# Patient Record
Sex: Male | Born: 1950 | Race: White | Hispanic: No | Marital: Married | Smoking: Former smoker
Health system: Southern US, Community
[De-identification: ages and names within clinical notes are randomized; demographics above are authoritative.]

## PROBLEM LIST (undated history)

## (undated) DIAGNOSIS — E039 Hypothyroidism, unspecified: Secondary | ICD-10-CM

## (undated) DIAGNOSIS — R42 Dizziness and giddiness: Secondary | ICD-10-CM

## (undated) DIAGNOSIS — M1990 Unspecified osteoarthritis, unspecified site: Secondary | ICD-10-CM

## (undated) DIAGNOSIS — Z8619 Personal history of other infectious and parasitic diseases: Secondary | ICD-10-CM

## (undated) DIAGNOSIS — G4730 Sleep apnea, unspecified: Secondary | ICD-10-CM

## (undated) DIAGNOSIS — S83281A Other tear of lateral meniscus, current injury, right knee, initial encounter: Secondary | ICD-10-CM

## (undated) DIAGNOSIS — E785 Hyperlipidemia, unspecified: Secondary | ICD-10-CM

## (undated) DIAGNOSIS — T7840XA Allergy, unspecified, initial encounter: Secondary | ICD-10-CM

## (undated) DIAGNOSIS — K635 Polyp of colon: Secondary | ICD-10-CM

## (undated) HISTORY — PX: JOINT REPLACEMENT: SHX530

## (undated) HISTORY — DX: Unspecified osteoarthritis, unspecified site: M19.90

## (undated) HISTORY — PX: TONSILLECTOMY AND ADENOIDECTOMY: SUR1326

## (undated) HISTORY — DX: Personal history of other infectious and parasitic diseases: Z86.19

## (undated) HISTORY — PX: COLONOSCOPY: SHX174

## (undated) HISTORY — DX: Dizziness and giddiness: R42

## (undated) HISTORY — DX: Allergy, unspecified, initial encounter: T78.40XA

## (undated) HISTORY — DX: Hyperlipidemia, unspecified: E78.5

## (undated) HISTORY — DX: Polyp of colon: K63.5

## (undated) HISTORY — PX: OTHER SURGICAL HISTORY: SHX169

---

## 2007-12-19 HISTORY — PX: OTHER SURGICAL HISTORY: SHX169

## 2011-10-19 HISTORY — PX: REPLACEMENT TOTAL KNEE: SUR1224

## 2018-03-18 HISTORY — PX: MENISCUS REPAIR: SHX5179

## 2019-09-09 ENCOUNTER — Ambulatory Visit (INDEPENDENT_AMBULATORY_CARE_PROVIDER_SITE_OTHER): Payer: Medicare Other | Admitting: Physician Assistant

## 2019-09-09 ENCOUNTER — Encounter: Payer: Self-pay | Admitting: Physician Assistant

## 2019-09-09 ENCOUNTER — Other Ambulatory Visit: Payer: Self-pay

## 2019-09-09 DIAGNOSIS — E7800 Pure hypercholesterolemia, unspecified: Secondary | ICD-10-CM | POA: Diagnosis not present

## 2019-09-09 DIAGNOSIS — Z86010 Personal history of colonic polyps: Secondary | ICD-10-CM | POA: Diagnosis not present

## 2019-09-09 DIAGNOSIS — M1712 Unilateral primary osteoarthritis, left knee: Secondary | ICD-10-CM | POA: Diagnosis not present

## 2019-09-09 DIAGNOSIS — E039 Hypothyroidism, unspecified: Secondary | ICD-10-CM | POA: Diagnosis not present

## 2019-09-09 DIAGNOSIS — E785 Hyperlipidemia, unspecified: Secondary | ICD-10-CM | POA: Insufficient documentation

## 2019-09-09 NOTE — Progress Notes (Signed)
Patient presents to clinic today to establish care.  Acute Concerns: Patient requesting referral to a local orthopedic provider. 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 Has had SLAP tear repair and rotator cuff repairs as well several years prior. Notes deterioration in the R knee. Was last seen by Ortho in the fall in [place]. Was getting injections at the time and told if not helping he would need to consider arthroscopy versus TKR. Marland Kitchen   Chronic Issues: Hyperlipidemia -- Is currently on a regimen of Simvastatin 20 mg daily. Endorses diet is well-balanced overall but needs to work on increased vegetable intake. Exercise is limited due to issues with knees.   Hypothyroidism -- Patient currently on a regimen of levothyroxine 75 mcg daily. Endorses taking medication daily as directed. Endorses last TSH level about 1 year ago. Denies constipation or excessive fatigue.   Health Maintenance: Immunizations -- Declines Flu shot today. Will need records for other immunizations.  Colonoscopy -- Endorses last in 11/2018. + history of colon polyps. Benign polyps on last colonoscopy. Will obtain records to verify but plan on 5-year recall.   Past Medical History:  Diagnosis Date  . Allergy   . Arthritis   . Colon polyps   . History of chickenpox   . Hyperlipidemia     Past Surgical History:  Procedure Laterality Date  . DG SHOULDER LEFT COMPLETE Left 06/2013  . MENISCUS REPAIR Right 03/2018  . 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 TONSILLECTOMY AND ADENOIDECTOMY      Current Outpatient Medications on File Prior to Visit  Medication Sig Dispense Refill  . Ascorbic Acid (VITAMIN C) 1000 MG tablet Take 1,000 mg by mouth daily.    . ferrous sulfate 325 (65 FE) MG EC tablet Take 325 mg by mouth daily with breakfast.    . Glucosamine-Chondroit-Vit C-Mn (GLUCOSAMINE CHONDR 1500 COMPLX) CAPS Take 1 capsule by mouth daily.    Marland Kitchen levothyroxine (SYNTHROID) 75  MCG tablet Take 1 tablet by mouth daily.    . Multiple Vitamins-Minerals (MULTIVITAMIN ADULTS 50+) TABS Take 1 tablet by mouth daily.    . Omega-3 Fatty Acids (FISH OIL TRIPLE STRENGTH) 1400 MG CAPS Take 1 capsule by mouth daily.    . saw palmetto (V-R SAW PALMETTO) 160 MG capsule Take 160 mg by mouth 2 (two) times daily.    . simvastatin (ZOCOR) 20 MG tablet Take 1 tablet by mouth daily.     No current facility-administered medications on file prior to visit.     No Known Allergies  Family History  Problem Relation Age of Onset  . COPD Mother     Social History   Socioeconomic History  . Marital status: Married    Spouse name: Not on file  . Number of children: Not on file  . Years of education: Not on file  . Highest education level: Not on file  Occupational History  . Not on file  Social Needs  . Financial resource strain: Not on file  . Food insecurity    Worry: Not on file    Inability: Not on file  . Transportation needs    Medical: Not on file    Non-medical: Not on file  Tobacco Use  . Smoking status: Former Research scientist (life sciences)  . Smokeless tobacco: Never Used  Substance and Sexual Activity  . Alcohol use: Yes  . Drug use: Not Currently  . Sexual activity: Not Currently  Lifestyle  . Physical activity  Days per week: Not on file    Minutes per session: Not on file  . Stress: Not on file  Relationships  . Social Musician on phone: Not on file    Gets together: Not on file    Attends religious service: Not on file    Active member of club or organization: Not on file    Attends meetings of clubs or organizations: Not on file    Relationship status: Not on file  . Intimate partner violence    Fear of current or ex partner: Not on file    Emotionally abused: Not on file    Physically abused: Not on file    Forced sexual activity: Not on file  Other Topics Concern  . Not on file  Social History Narrative  . Not on file   Review of Systems   Constitutional: Negative for fever, malaise/fatigue and weight loss.  Eyes: Negative for blurred vision and double vision.  Respiratory: Negative for shortness of breath.   Cardiovascular: Negative for chest pain and palpitations.  Genitourinary: Negative for dysuria, flank pain, frequency, hematuria and urgency.  Musculoskeletal: Positive for joint pain (chronic -- L knee mainly). Negative for back pain, falls, myalgias and neck pain.  Psychiatric/Behavioral: Negative for depression. The patient is not nervous/anxious.    BP 128/80   Pulse 62   Temp 98.4 F (36.9 C) (Skin)   Resp 16   Ht 5\' 7"  (1.702 m)   Wt 227 lb (103 kg)   SpO2 98%   BMI 35.55 kg/m   Physical Exam Vitals signs reviewed.  Constitutional:      Appearance: Normal appearance.  HENT:     Head: Normocephalic and atraumatic.     Right Ear: Tympanic membrane normal.     Left Ear: Tympanic membrane normal.     Nose: Nose normal.     Mouth/Throat:     Mouth: Mucous membranes are moist.  Eyes:     Conjunctiva/sclera: Conjunctivae normal.     Pupils: Pupils are equal, round, and reactive to light.  Neck:     Musculoskeletal: Neck supple.  Cardiovascular:     Rate and Rhythm: Normal rate and regular rhythm.     Pulses: Normal pulses.     Heart sounds: Normal heart sounds.  Pulmonary:     Breath sounds: Normal breath sounds.  Neurological:     General: No focal deficit present.     Mental Status: He is alert and oriented to person, place, and time.  Psychiatric:        Mood and Affect: Mood normal.     Assessment/Plan: 1. Pure hypercholesterolemia Taking statin as directed. Will review records to see when he is due for CPE and repeat labs. Dietary and exercise recommendations reviewed.  2. Acquired hypothyroidism Taking levothyroxine as directed. No symptoms present of uncontrolled hypothyroidism. Will get patient scheduled for CPE.  3. Primary osteoarthritis of left knee Referral to Orthopedics placed  giving worsening symptoms despite knee injections.  4. History of colon polyps Colonoscopy UTD -- last 11/2018 per patient. Records requested.    12/2018, PA-C

## 2019-09-09 NOTE — Patient Instructions (Addendum)
I will review records and we will get you and wife scheduled for physicals when due.   You will be contacted by an Orthopedic Specialist in the area for further assessment of chronic knee issues.   It was very nice meeting you today. Welcome to [facility]!

## 2019-09-15 ENCOUNTER — Telehealth: Payer: Self-pay | Admitting: Physician Assistant

## 2019-09-15 DIAGNOSIS — R0683 Snoring: Secondary | ICD-10-CM

## 2019-09-15 DIAGNOSIS — R0681 Apnea, not elsewhere classified: Secondary | ICD-10-CM

## 2019-09-15 NOTE — Telephone Encounter (Signed)
Patient called in wanting to see if he could get a referral for a Sleep Study. Please advise.

## 2019-09-17 NOTE — Telephone Encounter (Signed)
Spoke with patient and he says his wife complains of him snoring for years, she has noticed some apnea episodes, and does have some daytime sleepiness.  [place] for order for sleep study

## 2019-09-17 NOTE — Addendum Note (Signed)
Addended by: Raiford Noble C on: 09/17/2019 04:00 PM   Modules accepted: Orders

## 2019-09-17 NOTE — Telephone Encounter (Signed)
Referral to sleep medicine placed for assessment and to set up sleep testing.

## 2019-09-22 ENCOUNTER — Telehealth: Payer: Self-pay | Admitting: Physician Assistant

## 2019-09-22 DIAGNOSIS — M1712 Unilateral primary osteoarthritis, left knee: Secondary | ICD-10-CM

## 2019-09-22 NOTE — Addendum Note (Signed)
Addended by: Brunetta Jeans on: 09/22/2019 01:49 PM   Modules accepted: Orders

## 2019-09-22 NOTE — Telephone Encounter (Signed)
Patient calling in stating he would like a orthopedic referal for his knee. Please Advise.

## 2019-09-22 NOTE — Telephone Encounter (Signed)
Referral placed. Patient will be contacted to schedule. 

## 2019-10-06 ENCOUNTER — Telehealth: Payer: Self-pay | Admitting: Physician Assistant

## 2019-10-06 ENCOUNTER — Other Ambulatory Visit: Payer: Self-pay | Admitting: Physician Assistant

## 2019-10-06 MED ORDER — LEVOTHYROXINE SODIUM 75 MCG PO TABS: 75.0000 ug | ORAL_TABLET | Freq: Every day | ORAL | 1 refills | Status: DC

## 2019-10-06 NOTE — Telephone Encounter (Signed)
Will send in 78-month supply. He was supposed to schedule CPE with labs after last visit (NP appt) so we could update thyroid levels at the same time as routine labs.

## 2019-10-06 NOTE — Telephone Encounter (Signed)
Patient aware that 1 month script sent to pharmacy and has scheduled CPE 11.16.20

## 2019-10-06 NOTE — Telephone Encounter (Signed)
Refill Request  levothyroxine (SYNTHROID) 75 MCG tablet     [phone] (Phone) [phone] (Fax)   Patient has 2 days left.

## 2019-10-07 ENCOUNTER — Other Ambulatory Visit: Payer: Self-pay

## 2019-10-07 ENCOUNTER — Ambulatory Visit (INDEPENDENT_AMBULATORY_CARE_PROVIDER_SITE_OTHER): Payer: Medicare Other | Admitting: Pulmonary Disease

## 2019-10-07 ENCOUNTER — Encounter: Payer: Self-pay | Admitting: Pulmonary Disease

## 2019-10-07 VITALS — BP 128/80 | HR 70 | Temp 97.2°F | Ht 67.0 in | Wt 222.0 lb

## 2019-10-07 DIAGNOSIS — G4733 Obstructive sleep apnea (adult) (pediatric): Secondary | ICD-10-CM | POA: Diagnosis not present

## 2019-10-07 NOTE — Patient Instructions (Signed)
Moderate probability of significant sleep disordered breathing  We will schedule you for home sleep study Treatment options as discussed  I will see you in about 3 months  Call with significant concerns Sleep Apnea Sleep apnea is a condition in which breathing pauses or becomes shallow during sleep. Episodes of sleep apnea usually last 10 seconds or longer, and they may occur as many as 20 times an hour. Sleep apnea disrupts your sleep and keeps your body from getting the rest that it needs. This condition can increase your risk of certain health problems, including:  Heart attack.  Stroke.  Obesity.  Diabetes.  Heart failure.  Irregular heartbeat. What are the causes? There are three kinds of sleep apnea:  Obstructive sleep apnea. This kind is caused by a blocked or collapsed airway.  Central sleep apnea. This kind happens when the part of the brain that controls breathing does not send the correct signals to the muscles that control breathing.  Mixed sleep apnea. This is a combination of obstructive and central sleep apnea. The most common cause of this condition is a collapsed or blocked airway. An airway can collapse or become blocked if:  Your throat muscles are abnormally relaxed.  Your tongue and tonsils are larger than normal.  You are overweight.  Your airway is smaller than normal. What increases the risk? You are more likely to develop this condition if you:  Are overweight.  Smoke.  Have a smaller than normal airway.  Are elderly.  Are male.  Drink alcohol.  Take sedatives or tranquilizers.  Have a family history of sleep apnea. What are the signs or symptoms? Symptoms of this condition include:  Trouble staying asleep.  Daytime sleepiness and tiredness.  Irritability.  Loud snoring.  Morning headaches.  Trouble concentrating.  Forgetfulness.  Decreased interest in sex.  Unexplained sleepiness.  Mood swings.  Personality  changes.  Feelings of depression.  Waking up often during the night to urinate.  Dry mouth.  Sore throat. How is this diagnosed? This condition may be diagnosed with:  A medical history.  A physical exam.  A series of tests that are done while you are sleeping (sleep study). These tests are usually done in a sleep lab, but they may also be done at home. How is this treated? Treatment for this condition aims to restore normal breathing and to ease symptoms during sleep. It may involve managing health issues that can affect breathing, such as high blood pressure or obesity. Treatment may include:  Sleeping on your side.  Using a decongestant if you have nasal congestion.  Avoiding the use of depressants, including alcohol, sedatives, and narcotics.  Losing weight if you are overweight.  Making changes to your diet.  Quitting smoking.  Using a device to open your airway while you sleep, such as: ? An oral appliance. This is a custom-made mouthpiece that shifts your lower jaw forward. ? A continuous positive airway pressure (CPAP) device. This device blows air through a mask when you breathe out (exhale). ? A nasal expiratory positive airway pressure (EPAP) device. This device has valves that you put into each nostril. ? A bi-level positive airway pressure (BPAP) device. This device blows air through a mask when you breathe in (inhale) and breathe out (exhale).  Having surgery if other treatments do not work. During surgery, excess tissue is removed to create a wider airway. It is important to get treatment for sleep apnea. Without treatment, this condition can lead to:  High blood pressure.  Coronary artery disease.  In men, an inability to achieve or maintain an erection (impotence).  Reduced thinking abilities. Follow these instructions at home: Lifestyle  Make any lifestyle changes that your health care provider recommends.  Eat a healthy, well-balanced diet.   Take steps to lose weight if you are overweight.  Avoid using depressants, including alcohol, sedatives, and narcotics.  Do not use any products that contain nicotine or tobacco, such as cigarettes, e-cigarettes, and chewing tobacco. If you need help quitting, ask your health care provider. General instructions  Take over-the-counter and prescription medicines only as told by your health care provider.  If you were given a device to open your airway while you sleep, use it only as told by your health care provider.  If you are having surgery, make sure to tell your health care provider you have sleep apnea. You may need to bring your device with you.  Keep all follow-up visits as told by your health care provider. This is important. Contact a health care provider if:  The device that you received to open your airway during sleep is uncomfortable or does not seem to be working.  Your symptoms do not improve.  Your symptoms get worse. Get help right away if:  You develop: ? Chest pain. ? Shortness of breath. ? Discomfort in your back, arms, or stomach.  You have: ? Trouble speaking. ? Weakness on one side of your body. ? Drooping in your face. These symptoms may represent a serious problem that is an emergency. Do not wait to see if the symptoms will go away. Get medical help right away. Call your local emergency services (911 in the [place] Do not drive yourself to the hospital. Summary  Sleep apnea is a condition in which breathing pauses or becomes shallow during sleep.  The most common cause is a collapsed or blocked airway.  The goal of treatment is to restore normal breathing and to ease symptoms during sleep. This information is not intended to replace advice given to you by your health care provider. Make sure you discuss any questions you have with your health care provider. Document Released: 11/24/2002 Document Revised: 09/20/2018 Document Reviewed: 07/30/2018  Elsevier Patient Education  2020 [employer].

## 2019-10-07 NOTE — Progress Notes (Signed)
Subjective:    Patient ID: Eric Kerr, male    DOB: 1951-05-04, 68 y.o.   MRN: [id number]  Patient with a history of snoring, concern about witnessed apneas  Patient has been told multiple times by spouse and acquaintances about his snoring Usually goes to bed about 11:50 Takes about 10 to 20 minutes to fall asleep  Wakes up 3-4 times during the night Awakening time about 7:54 AM  History of snoring Dryness of his mouth in the mornings His memory has been fine Rare headaches in the mornings  No night sweats  He does admit to some choking at night  No history of heart disease  Sleep environment is conducive to sleep, occasionally does have a pet in bed    Past Medical History:  Diagnosis Date  . Allergy   . Arthritis   . Colon polyps   . History of chickenpox   . Hyperlipidemia    Social History   Socioeconomic History  . Marital status: Married    Spouse name: Not on file  . Number of children: Not on file  . Years of education: Not on file  . Highest education level: Not on file  Occupational History  . Not on file  Social Needs  . Financial resource strain: Not on file  . Food insecurity    Worry: Not on file    Inability: Not on file  . Transportation needs    Medical: Not on file    Non-medical: Not on file  Tobacco Use  . Smoking status: Former Smoker    Packs/day: 0.25    Years: 10.00    Pack years: 2.50    Types: Cigarettes    Quit date: 2010    Years since quitting: 10.8  . Smokeless tobacco: Never Used  Substance and Sexual Activity  . Alcohol use: Yes  . Drug use: Not Currently  . Sexual activity: Not Currently  Lifestyle  . Physical activity    Days per week: Not on file    Minutes per session: Not on file  . Stress: Not on file  Relationships  . Social Musician on phone: Not on file    Gets together: Not on file    Attends religious service: Not on file    Active member of club or organization: Not on file   Attends meetings of clubs or organizations: Not on file    Relationship status: Not on file  . Intimate partner violence    Fear of current or ex partner: Not on file    Emotionally abused: Not on file    Physically abused: Not on file    Forced sexual activity: Not on file  Other Topics Concern  . Not on file  Social History Narrative  . Not on file   Family History  Problem Relation Age of Onset  . COPD Mother     Review of Systems  Constitutional: Negative.  Negative for appetite change and fatigue.  HENT: Negative.   Eyes: Negative.   Respiratory: Positive for apnea.   Cardiovascular: Negative.   Gastrointestinal: Negative.   Endocrine: Negative.       Objective:   Physical Exam Constitutional:      Appearance: He is obese.  HENT:     Head: Normocephalic and atraumatic.     Nose: Nose normal. No congestion.     Mouth/Throat:     Mouth: Mucous membranes are moist.     Comments: Crowded  oropharynx, Mallampati 2 Eyes:     General:        Right eye: No discharge.        Left eye: No discharge.     Pupils: Pupils are equal, round, and reactive to light.  Neck:     Musculoskeletal: Normal range of motion.  Cardiovascular:     Rate and Rhythm: Normal rate and regular rhythm.     Pulses: Normal pulses.     Heart sounds: No murmur. No friction rub.  Pulmonary:     Effort: Pulmonary effort is normal. No respiratory distress.     Breath sounds: Normal breath sounds. No stridor. No wheezing, rhonchi or rales.  Abdominal:     General: There is no distension.  Musculoskeletal: Normal range of motion.        General: No swelling.  Skin:    General: Skin is warm.  Neurological:     General: No focal deficit present.     Mental Status: He is alert.  Psychiatric:        Mood and Affect: Mood normal.    Vitals:   10/07/19 1633  BP: 128/80  Pulse: 70  Temp: (!) 97.2 F (36.2 C)  SpO2: 96%   Results of the Epworth flowsheet 10/07/2019  Sitting and reading 2   Watching TV 2  Sitting, inactive in a public place (e.g. a theatre or a meeting) 1  As a passenger in a car for an hour without a break 1  Lying down to rest in the afternoon when circumstances permit 2  Sitting and talking to someone 0  Sitting quietly after a lunch without alcohol 2  In a car, while stopped for a few minutes in traffic 0  Total score 10      Assessment & Plan:  High probability for significant obstructive sleep apnea .  Snoring, witnessed apnea, excessive daytime sleepiness, gasping respirations at night .  Crowded oropharynx  Obesity .  Weight gain over the years  Excessive daytime sleepiness .  Sleepiness most afternoons  Nonrestorative sleep .  Wakes up many days feeling like he has not a good nights rest  Pathophysiology of sleep disordered breathing discussed with the patient  Treatment options for sleep disordered breathing discussed with the patient   Plan: We will schedule the patient for home sleep study  Importance of exercise and weight loss discussed with the patient  I will see him back in the office in about 3 months

## 2019-10-28 ENCOUNTER — Other Ambulatory Visit: Payer: Self-pay

## 2019-10-28 ENCOUNTER — Ambulatory Visit: Payer: Medicare Other

## 2019-10-28 DIAGNOSIS — G4733 Obstructive sleep apnea (adult) (pediatric): Secondary | ICD-10-CM | POA: Diagnosis not present

## 2019-10-29 DIAGNOSIS — G4733 Obstructive sleep apnea (adult) (pediatric): Secondary | ICD-10-CM | POA: Diagnosis not present

## 2019-10-31 ENCOUNTER — Telehealth: Payer: Self-pay | Admitting: Pulmonary Disease

## 2019-10-31 DIAGNOSIS — G4733 Obstructive sleep apnea (adult) (pediatric): Secondary | ICD-10-CM

## 2019-10-31 NOTE — Telephone Encounter (Signed)
Called and spoke with Patient.  Dr. Judson Roch recommendations given.  Understanding stated.  DME order placed.  Patient stated he would contact office for appointment once cpap was started.  Nothing further at this time.  Dr. Ander Slade has reviewed the home sleep test this showed moderate obstructive sleep apnea.   Recommendations   Treatment options are CPAP with the settings auto 5 to 15.    Weight loss measures .   Advise against driving while sleepy & against medication with sedative side effects.    Make appointment for 3 months for compliance with download with Dr. Ander Slade.

## 2019-11-03 ENCOUNTER — Encounter: Payer: Medicare Other | Admitting: Physician Assistant

## 2019-11-05 ENCOUNTER — Other Ambulatory Visit: Payer: Self-pay

## 2019-11-05 ENCOUNTER — Ambulatory Visit (INDEPENDENT_AMBULATORY_CARE_PROVIDER_SITE_OTHER): Payer: Medicare Other | Admitting: Physician Assistant

## 2019-11-05 ENCOUNTER — Encounter: Payer: Self-pay | Admitting: Physician Assistant

## 2019-11-05 VITALS — BP 124/80 | HR 67 | Temp 98.2°F | Resp 16 | Ht 67.75 in | Wt 233.0 lb

## 2019-11-05 DIAGNOSIS — Z1159 Encounter for screening for other viral diseases: Secondary | ICD-10-CM

## 2019-11-05 DIAGNOSIS — G4733 Obstructive sleep apnea (adult) (pediatric): Secondary | ICD-10-CM | POA: Diagnosis not present

## 2019-11-05 DIAGNOSIS — E039 Hypothyroidism, unspecified: Secondary | ICD-10-CM

## 2019-11-05 DIAGNOSIS — Z23 Encounter for immunization: Secondary | ICD-10-CM | POA: Diagnosis not present

## 2019-11-05 DIAGNOSIS — Z0000 Encounter for general adult medical examination without abnormal findings: Secondary | ICD-10-CM

## 2019-11-05 DIAGNOSIS — E7800 Pure hypercholesterolemia, unspecified: Secondary | ICD-10-CM

## 2019-11-05 DIAGNOSIS — Z125 Encounter for screening for malignant neoplasm of prostate: Secondary | ICD-10-CM

## 2019-11-05 LAB — TSH: TSH: 3.43 u[IU]/mL (ref 0.35–4.50)

## 2019-11-05 LAB — LIPID PANEL
Cholesterol: 149 mg/dL (ref 0–200)
HDL: 47.1 mg/dL (ref >39.00)
LDL Cholesterol: 87 mg/dL (ref 0–99)
NonHDL: 102.07
Total CHOL/HDL Ratio: 3
Triglycerides: 77 mg/dL (ref 0.0–149.0)
VLDL: 15.4 mg/dL (ref 0.0–40.0)

## 2019-11-05 LAB — COMPREHENSIVE METABOLIC PANEL
ALT: 22 U/L (ref 0–53)
AST: 18 U/L (ref 0–37)
Albumin: 4.3 g/dL (ref 3.5–5.2)
Alkaline Phosphatase: 74 U/L (ref 39–117)
BUN: 9 mg/dL (ref 6–23)
CO2: 30 mEq/L (ref 19–32)
Calcium: 9.2 mg/dL (ref 8.4–10.5)
Chloride: 102 mEq/L (ref 96–112)
Creatinine, Ser: 0.99 mg/dL (ref 0.40–1.50)
GFR: 75.45 mL/min (ref >60.00)
Glucose, Bld: 94 mg/dL (ref 70–99)
Potassium: 4.3 mEq/L (ref 3.5–5.1)
Sodium: 139 mEq/L (ref 135–145)
Total Bilirubin: 0.6 mg/dL (ref 0.2–1.2)
Total Protein: 7 g/dL (ref 6.0–8.3)

## 2019-11-05 LAB — PSA, MEDICARE: PSA: 1.4 ng/ml (ref 0.10–4.00)

## 2019-11-05 MED ORDER — SIMVASTATIN 20 MG PO TABS: 20.0000 mg | ORAL_TABLET | Freq: Every day | ORAL | 1 refills | Status: DC

## 2019-11-05 MED ORDER — LEVOTHYROXINE SODIUM 75 MCG PO TABS: 75.0000 ug | ORAL_TABLET | Freq: Every day | ORAL | 1 refills | Status: DC

## 2019-11-05 NOTE — Progress Notes (Signed)
Subjective:   Eric Kerr is a 68 y.o. male who presents for an Initial Medicare Annual Wellness Visit.  Hypothyroidism-- levothyroxine 75 mcg, taking as directed, tolerating well. Denies chest pain, palpitations, diaphoresis, temperature intolerance, changes to nair or nails, changes to mood.  Hyperlipidemia-- taking simvastatin 20 mg, tolerating well. Endorses well balanced diet. Stays well hydrated throughout the day. Exercise on stationary bike, body weight activities 3x/week, walks dog daily.   Sleep apnea-- started CPAP last night  Review of Systems  Review of Systems  Constitutional: Negative for chills, fever and weight loss.  HENT: Negative for hearing loss and tinnitus.   Eyes: Negative for blurred vision and double vision.  Respiratory: Negative for cough and shortness of breath.   Cardiovascular: Negative for chest pain and palpitations.  Gastrointestinal: Negative for abdominal pain, blood in stool, constipation, diarrhea, heartburn, melena, nausea and vomiting.  Genitourinary: Negative for dysuria, frequency and urgency.       Nocturia 1-2x/night  Musculoskeletal: Positive for joint pain (knee pain).  Neurological: Negative for dizziness, loss of consciousness and headaches.  Psychiatric/Behavioral: Negative for depression and suicidal ideas. The patient has insomnia (sleep apnea). The patient is not nervous/anxious.         Objective:    Today's Vitals   11/05/19 0931  Resp: 16  Weight: 233 lb (105.7 kg)  Height: 5' 7.75" (1.721 m)   Body mass index is 35.69 kg/m.  Advanced Directives 11/05/2019  Does Patient Have a Medical Advance Directive? Yes  Type of Paramedic of [place];Living will  Copy of [place] in Chart? No - copy requested    Current Medications (verified) Outpatient Encounter Medications as of 11/05/2019  Medication Sig  . Ascorbic Acid (VITAMIN C) 1000 MG tablet Take 1,000 mg by mouth  daily.  . ferrous sulfate 325 (65 FE) MG EC tablet Take 325 mg by mouth daily with breakfast.  . Glucosamine-Chondroit-Vit C-Mn (GLUCOSAMINE CHONDR 1500 COMPLX) CAPS Take 1 capsule by mouth daily.  Marland Kitchen levothyroxine (SYNTHROID) 75 MCG tablet Take 1 tablet (75 mcg total) by mouth daily.  . Multiple Vitamins-Minerals (MULTIVITAMIN ADULTS 50+) TABS Take 1 tablet by mouth daily.  . Omega-3 Fatty Acids (FISH OIL TRIPLE STRENGTH) 1400 MG CAPS Take 1 capsule by mouth daily.  . saw palmetto (V-R SAW PALMETTO) 160 MG capsule Take 160 mg by mouth 2 (two) times daily.  . simvastatin (ZOCOR) 20 MG tablet Take 1 tablet by mouth daily.   No facility-administered encounter medications on file as of 11/05/2019.     Allergies (verified) Patient has no known allergies.   History: Past Medical History:  Diagnosis Date  . Allergy   . Arthritis   . Colon polyps   . History of chickenpox   . Hyperlipidemia    Past Surgical History:  Procedure Laterality Date  . DG SHOULDER LEFT COMPLETE Left 06/2013  . MENISCUS REPAIR Right 03/2018  . 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 TONSILLECTOMY AND ADENOIDECTOMY     Family History  Problem Relation Age of Onset  . COPD Mother    Social History   Socioeconomic History  . Marital status: Married    Spouse name: Not on file  . Number of children: Not on file  . Years of education: Not on file  . Highest education level: Not on file  Occupational History  . Not on file  Social Needs  . Financial resource strain: Not on file  . Food insecurity  Worry: Not on file    Inability: Not on file  . Transportation needs    Medical: Not on file    Non-medical: Not on file  Tobacco Use  . Smoking status: Former Smoker    Packs/day: 0.25    Years: 10.00    Pack years: 2.50    Types: Cigarettes    Quit date: 2010    Years since quitting: 10.8  . Smokeless tobacco: Never Used  Substance and Sexual Activity  . Alcohol use: Yes  . Drug use: Not  Currently  . Sexual activity: Not Currently  Lifestyle  . Physical activity    Days per week: Not on file    Minutes per session: Not on file  . Stress: Not on file  Relationships  . Social connections    Talks on phone: Not on file    Gets together: Not on file    Attends religious service: Not on file    Active member of club or organization: Not on file    Attends meetings of clubs or organizations: Not on file    Relationship status: Not on file  Other Topics Concern  . Not on file  Social History Narrative  . Not on file   Tobacco Counseling Counseling given: Yes   Clinical Intake:  Pre-visit preparation completed: No  Pain : No/denies pain     BMI - recorded: 35.69 Nutritional Status: BMI > 30  Obese Diabetes: No  How often do you need to have someone help you when you read instructions, pamphlets, or other written materials from your doctor or pharmacy?: 1 - Never  Interpreter Needed?: No     Activities of Daily Living In your present state of health, do you have any difficulty performing the following activities: 11/05/2019 09/09/2019  Hearing? N N  Vision? N N  Difficulty concentrating or making decisions? N N  Walking or climbing stairs? N N  Dressing or bathing? N N  Doing errands, shopping? N N  Preparing Food and eating ? N -  Using the Toilet? N -  In the past six months, have you accidently leaked urine? N -  Do you have problems with loss of bowel control? N -  Managing your Medications? N -  Managing your Finances? N -  Housekeeping or managing your Housekeeping? N -     Immunizations and Health Maintenance Immunization History  Administered Date(s) Administered  . Pneumococcal Polysaccharide-23 01/07/2013   Health Maintenance Due  Topic Date Due  . Hepatitis C Screening  07/18/1951  . COLONOSCOPY  01/19/2003  . PNA vac Low Risk Adult (1 of 2 - PCV13) 01/19/2018    Patient Care Team: Martin, William C, PA-C as PCP - General  (Family Medicine)  Indicate any recent Medical Services you may have received from other than [facility] providers in the past year (date may be approximate).    Assessment:   This is a routine wellness examination for Eric Kerr.  Hearing/Vision screen No exam data present  Dietary issues and exercise activities discussed: Current Exercise Habits: Home exercise routine, Type of exercise: Other - see comments(Stationary bike), Time (Minutes): 40, Frequency (Times/Week): 5, Weekly Exercise (Minutes/Week): 200, Intensity: Mild  Goals   None    Depression Screen PHQ 2/9 Scores 11/05/2019 09/09/2019  PHQ - 2 Score 0 0  PHQ- 9 Score 0 0    Fall Risk Fall Risk  11/05/2019 09/09/2019  Falls in the past year? 0 0  Number falls in past   yr: 0 0  Injury with Fall? 0 0  Follow up Falls evaluation completed Falls evaluation completed    Is the patient's home free of loose throw rugs in walkways, pet beds, electrical cords, etc?   yes      Grab bars in the bathroom? yes      Handrails on the stairs?   yes      Adequate lighting?   yes  Timed Get Up and Go performed:   Cognitive Function:        Screening Tests Health Maintenance  Topic Date Due  . Hepatitis C Screening  08/25/1951  . COLONOSCOPY  01/19/2003  . PNA vac Low Risk Adult (1 of 2 - PCV13) 01/19/2018  . INFLUENZA VACCINE  03/17/2020 (Originally 07/19/2019)  . DTaP/Tdap/Td (1 - Tdap) 11/04/2020 (Originally 01/20/1972)  . TETANUS/TDAP  11/04/2020 (Originally 01/20/1972)    Qualifies for Shingles Vaccine? Yes  Cancer Screenings: Lung: Low Dose CT Chest recommended if Age 55-80 years, 30 pack-year currently smoking OR have quit w/in 15years. Patient does not qualify. Colorectal: patient notes colonoscopy last year in [place]   Additional Screenings: Hepatitis C Screening: plan to update today       Plan:  1. Encounter for Medicare annual wellness exam Depression screening negative.  Health maintenance reviewed. Will update  Hepatitis C screening and PCV13 immunization today.  Age appropriate preventative care discussed, patient handout provided.   2. Acquired hypothyroidism Currently on regimen of Levothyroxine 75 mcg QD, tolerating well. Patient notes taking medication at night, advised he take it first thing in the morning. Will recheck TSH levels today.  - TSH  3. Pure hypercholesterolemia Currently on regimen of Simvastatin 20 mg QD, taking as directed, tolerating well. Encouraged patient to continue working on well-balanced diet and continue with exercise regimen, recommend at least 150 min/week. Will obtain repeat labs today.  - Comp Met (CMET) - Lipid Profile  4. OSA (obstructive sleep apnea) Patient with daytime sleepiness and fatigue. Followed by Dr. Olalere in pulmonology, started on CPAP machine last night.   5. Need for hepatitis C screening test Discussed need for one time screen, will update with routine labs today  - Hepatitis C Antibody  6. Prostate cancer screening Discussed need for prostate cancer screening, will repeat labs today.  - PSA, Medicare  7. Need for vaccination against Streptococcus pneumoniae using pneumococcal conjugate vaccine 13 PCV13 updated today - Prevnar  I have personally reviewed and noted the following in the patient's chart:   . Medical and social history . Use of alcohol, tobacco or illicit drugs  . Current medications and supplements . Functional ability and status . Nutritional status . Physical activity . Advanced directives . List of other physicians . Hospitalizations, surgeries, and ER visits in previous 12 months . Vitals . Screenings to include cognitive, depression, and falls . Referrals and appointments  In addition, I have reviewed and discussed with patient certain preventive protocols, quality metrics, and best practice recommendations. A written personalized care plan for preventive services as well as general preventive health  recommendations were provided to patient.     William Cody Martin, PA-C   11/05/2019       

## 2019-11-05 NOTE — Patient Instructions (Signed)
Please go to the lab for blood work.   Our office will call you with your results unless you have chosen to receive results via MyChart.  If your blood work is normal we will follow-up each year for physicals and as scheduled for chronic medical problems.  If anything is abnormal we will treat accordingly and get you in for a follow-up.  You can go to [place] for drive through testing so you can get a negative test before having to go see family.    Preventive Care 66 Years and Older, Male Preventive care refers to lifestyle choices and visits with your health care provider that can promote health and wellness. This includes:  A yearly physical exam. This is also called an annual well check.  Regular dental and eye exams.  Immunizations.  Screening for certain conditions.  Healthy lifestyle choices, such as diet and exercise. What can I expect for my preventive care visit? Physical exam Your health care provider will check:  Height and weight. These may be used to calculate body mass index (BMI), which is a measurement that tells if you are at a healthy weight.  Heart rate and blood pressure.  Your skin for abnormal spots. Counseling Your health care provider may ask you questions about:  Alcohol, tobacco, and drug use.  Emotional well-being.  Home and relationship well-being.  Sexual activity.  Eating habits.  History of falls.  Memory and ability to understand (cognition).  Work and work Statistician. What immunizations do I need?  Influenza (flu) vaccine  This is recommended every year. Tetanus, diphtheria, and pertussis (Tdap) vaccine  You may need a Td booster every 10 years. Varicella (chickenpox) vaccine  You may need this vaccine if you have not already been vaccinated. Zoster (shingles) vaccine  You may need this after age 66. Pneumococcal conjugate (PCV13) vaccine  One dose is recommended after age 22. Pneumococcal polysaccharide  (PPSV23) vaccine  One dose is recommended after age 66. Measles, mumps, and rubella (MMR) vaccine  You may need at least one dose of MMR if you were born in 1957 or later. You may also need a second dose. Meningococcal conjugate (MenACWY) vaccine  You may need this if you have certain conditions. Hepatitis A vaccine  You may need this if you have certain conditions or if you travel or work in places where you may be exposed to hepatitis A. Hepatitis B vaccine  You may need this if you have certain conditions or if you travel or work in places where you may be exposed to hepatitis B. Haemophilus influenzae type b (Hib) vaccine  You may need this if you have certain conditions. You may receive vaccines as individual doses or as more than one vaccine together in one shot (combination vaccines). Talk with your health care provider about the risks and benefits of combination vaccines. What tests do I need? Blood tests  Lipid and cholesterol levels. These may be checked every 5 years, or more frequently depending on your overall health.  Hepatitis C test.  Hepatitis B test. Screening  Lung cancer screening. You may have this screening every year starting at age 18 if you have a 30-pack-year history of smoking and currently smoke or have quit within the past 15 years.  Colorectal cancer screening. All adults should have this screening starting at age 66 and continuing until age 50. Your health care provider may recommend screening at age 66 if you are at increased risk. You will  have tests every 1-10 years, depending on your results and the type of screening test.  Prostate cancer screening. Recommendations will vary depending on your family history and other risks.  Diabetes screening. This is done by checking your blood sugar (glucose) after you have not eaten for a while (fasting). You may have this done every 1-3 years.  Abdominal aortic aneurysm (AAA) screening. You may need this  if you are a current or former smoker.  Sexually transmitted disease (STD) testing. Follow these instructions at home: Eating and drinking  Eat a diet that includes fresh fruits and vegetables, whole grains, lean protein, and low-fat dairy products. Limit your intake of foods with high amounts of sugar, saturated fats, and salt.  Take vitamin and mineral supplements as recommended by your health care provider.  Do not drink alcohol if your health care provider tells you not to drink.  If you drink alcohol: ? Limit how much you have to 0-2 drinks a day. ? Be aware of how much alcohol is in your drink. In the [place] one drink equals one 12 oz bottle of beer (355 mL), one 5 oz glass of wine (148 mL), or one 1 oz glass of hard liquor (44 mL). Lifestyle  Take daily care of your teeth and gums.  Stay active. Exercise for at least 30 minutes on 5 or more days each week.  Do not use any products that contain nicotine or tobacco, such as cigarettes, e-cigarettes, and chewing tobacco. If you need help quitting, ask your health care provider.  If you are sexually active, practice safe sex. Use a condom or other form of protection to prevent STIs (sexually transmitted infections).  Talk with your health care provider about taking a low-dose aspirin or statin. What's next?  Visit your health care provider once a year for a well check visit.  Ask your health care provider how often you should have your eyes and teeth checked.  Stay up to date on all vaccines. This information is not intended to replace advice given to you by your health care provider. Make sure you discuss any questions you have with your health care provider. Document Released: 12/31/2015 Document Revised: 11/28/2018 Document Reviewed: 11/28/2018 Elsevier Patient Education  2020 [employer]. .

## 2019-11-06 ENCOUNTER — Encounter: Payer: Self-pay | Admitting: Physician Assistant

## 2019-11-06 ENCOUNTER — Encounter: Payer: Self-pay | Admitting: Emergency Medicine

## 2019-11-06 LAB — HEPATITIS C ANTIBODY
Hepatitis C Ab: NONREACTIVE
SIGNAL TO CUT-OFF: 0.04 (ref <1.00)

## 2019-11-21 ENCOUNTER — Other Ambulatory Visit: Payer: Self-pay

## 2019-12-08 ENCOUNTER — Other Ambulatory Visit: Payer: Self-pay

## 2019-12-08 ENCOUNTER — Encounter: Payer: Self-pay | Admitting: Pulmonary Disease

## 2019-12-08 ENCOUNTER — Ambulatory Visit (INDEPENDENT_AMBULATORY_CARE_PROVIDER_SITE_OTHER): Payer: Medicare Other | Admitting: Pulmonary Disease

## 2019-12-08 DIAGNOSIS — G4733 Obstructive sleep apnea (adult) (pediatric): Secondary | ICD-10-CM | POA: Diagnosis not present

## 2019-12-08 DIAGNOSIS — Z9989 Dependence on other enabling machines and devices: Secondary | ICD-10-CM | POA: Diagnosis not present

## 2019-12-08 NOTE — Progress Notes (Signed)
Virtual Visit via Telephone Note  I connected with Eric Kerr on 12/08/19 at 12:00 PM EST by telephone and verified that I am speaking with the correct person using two identifiers.  Location: Patient: Eric Kerr Provider: Adrian Prince discussed the limitations, risks, security and privacy concerns of performing an evaluation and management service by telephone and the availability of in person appointments. I also discussed with the patient that there may be a patient responsible charge related to this service. The patient expressed understanding and agreed to proceed.   History of Present Illness: Telephone visit for follow-up for obstructive sleep apnea  Took him a few days to about a week to get used to using CPAP on a regular Now uses it nightly Feels better  Sleeping well at night   Observations/Objective:  Sounds well on the phone  Compliance data reviewed showing excellent compliance 100% Average use 7 hours 14 minutes Pressure settings 5-15 Residual AHI of 0.4  Assessment and Plan: Obstructive sleep apnea adequately treated with CPAP therapy  Obesity -Working on weight loss  Follow Up Instructions: No changes to be made   I discussed the assessment and treatment plan with the patient. The patient was provided an opportunity to ask questions and all were answered. The patient agreed with the plan and demonstrated an understanding of the instructions.   The patient was advised to call back or seek an in-person evaluation if the symptoms worsen or if the condition fails to improve as anticipated.  I provided 12 minutes of non-face-to-face time during this encounter.   Laurin Coder, MD

## 2019-12-08 NOTE — Patient Instructions (Signed)
Obstructive sleep apnea, adequately treated with CPAP therapy  No changes to be made  Follow-up in 6 months

## 2019-12-10 ENCOUNTER — Telehealth: Payer: Self-pay | Admitting: Pulmonary Disease

## 2019-12-10 NOTE — Telephone Encounter (Signed)
Pt's OV notes have been faxed. Nothing further needed. 

## 2020-01-02 ENCOUNTER — Telehealth: Payer: Self-pay | Admitting: Pulmonary Disease

## 2020-01-02 NOTE — Telephone Encounter (Signed)
Called and spoke with pt about his upcoming OV. While looking at pt's last OV with AO from 12/21, AO had said for pt to f/u with him in 6 months. Stated to pt that the OV 1/20 is not needed due to him to f/u in 6 months so stated to pt that I would cancel that OV.  Pt verbalized understanding. Nothing further needed.

## 2020-01-07 ENCOUNTER — Ambulatory Visit: Payer: Medicare Other | Admitting: Pulmonary Disease

## 2020-02-07 ENCOUNTER — Ambulatory Visit: Payer: Medicare Other | Attending: Internal Medicine

## 2020-02-07 DIAGNOSIS — Z23 Encounter for immunization: Secondary | ICD-10-CM | POA: Insufficient documentation

## 2020-02-07 NOTE — Progress Notes (Signed)
   Covid-19 Vaccination Clinic  Name:  Eric Kerr    MRN: [id number] DOB: 27-Oct-1951  02/07/2020  [name] was observed post Covid-19 immunization for 15 minutes without incidence. He was provided with Vaccine Information Sheet and instruction to access the V-Safe system.   [name] was instructed to call 911 with any severe reactions post vaccine: Marland Kitchen Difficulty breathing  . Swelling of your face and throat  . A fast heartbeat  . A bad rash all over your body  . Dizziness and weakness    Immunizations Administered    Name Date Dose VIS Date Route   Pfizer COVID-19 Vaccine 02/07/2020  2:57 PM 0.3 mL 11/28/2019 Intramuscular   Manufacturer: [employer], [employer]   Lot: [id number]   NDC: [address]-9

## 2020-03-01 ENCOUNTER — Ambulatory Visit: Payer: Medicare Other

## 2020-03-03 ENCOUNTER — Ambulatory Visit: Payer: Medicare Other | Attending: Internal Medicine

## 2020-03-03 DIAGNOSIS — Z23 Encounter for immunization: Secondary | ICD-10-CM

## 2020-03-03 NOTE — Progress Notes (Signed)
   Covid-19 Vaccination Clinic  Name:  Eric Kerr    MRN: [id number] DOB: [birth year]  03/03/2020  Eric Kerr was observed post Covid-19 immunization for 15 minutes without incident. He was provided with Vaccine Information Sheet and instruction to access the V-Safe system.   Eric Kerr was instructed to call 911 with any severe reactions post vaccine: Marland Kitchen Difficulty breathing  . Swelling of face and throat  . A fast heartbeat  . A bad rash all over body  . Dizziness and weakness   Immunizations Administered    Name Date Dose VIS Date Route   Pfizer COVID-19 Vaccine 03/03/2020 10:48 AM 0.3 mL 11/28/2019 Intramuscular   Manufacturer: [employer], [employer]   Lot: [id number]   NDC: [address]-2

## 2020-05-24 ENCOUNTER — Other Ambulatory Visit: Payer: Self-pay | Admitting: Physician Assistant

## 2020-05-24 DIAGNOSIS — E7800 Pure hypercholesterolemia, unspecified: Secondary | ICD-10-CM

## 2020-05-24 NOTE — Telephone Encounter (Signed)
Patient called to see if refill request from [facility] had been sent yet?

## 2020-06-13 ENCOUNTER — Other Ambulatory Visit: Payer: Self-pay | Admitting: Physician Assistant

## 2020-06-13 DIAGNOSIS — E039 Hypothyroidism, unspecified: Secondary | ICD-10-CM

## 2020-06-14 ENCOUNTER — Other Ambulatory Visit: Payer: Self-pay

## 2020-06-14 DIAGNOSIS — E039 Hypothyroidism, unspecified: Secondary | ICD-10-CM

## 2020-06-14 MED ORDER — LEVOTHYROXINE SODIUM 75 MCG PO TABS: 75.0000 ug | ORAL_TABLET | Freq: Every day | ORAL | 1 refills | Status: DC

## 2020-08-23 ENCOUNTER — Other Ambulatory Visit: Payer: Self-pay | Admitting: Physician Assistant

## 2020-08-23 DIAGNOSIS — E7800 Pure hypercholesterolemia, unspecified: Secondary | ICD-10-CM

## 2020-08-25 ENCOUNTER — Other Ambulatory Visit: Payer: Self-pay | Admitting: Physician Assistant

## 2020-08-25 DIAGNOSIS — E7800 Pure hypercholesterolemia, unspecified: Secondary | ICD-10-CM

## 2020-09-23 ENCOUNTER — Encounter (HOSPITAL_BASED_OUTPATIENT_CLINIC_OR_DEPARTMENT_OTHER): Payer: Self-pay | Admitting: Orthopedic Surgery

## 2020-09-23 ENCOUNTER — Other Ambulatory Visit: Payer: Self-pay

## 2020-09-23 NOTE — Progress Notes (Signed)
Spoke w/ via phone for pre-op interview---pt Lab needs dos----   none            Lab results------none COVID test ------09-25-2020 at 920 Arrive at -------1030 am 09-28-2020 NPO after MN NO Solid Food.  Clear liquids from MN until---930 am then npo Medications to take morning of surgery -----levothyroxine Diabetic medication -----n/a Patient Special Instructions -----none Pre-Op special Istructions -----none Patient verbalized understanding of instructions that were given at this phone interview. Patient denies shortness of breath, chest pain, fever, cough at this phone interview.

## 2020-09-24 ENCOUNTER — Other Ambulatory Visit (HOSPITAL_COMMUNITY): Payer: Medicare Other

## 2020-09-25 ENCOUNTER — Other Ambulatory Visit (HOSPITAL_COMMUNITY)
Admission: RE | Admit: 2020-09-25 | Discharge: 2020-09-25 | Disposition: A | Discharge status: Home / Self Care | Payer: Medicare Other | Source: Ambulatory Visit | Attending: Orthopedic Surgery | Admitting: Orthopedic Surgery

## 2020-09-25 DIAGNOSIS — Z20822 Contact with and (suspected) exposure to covid-19: Secondary | ICD-10-CM | POA: Insufficient documentation

## 2020-09-25 DIAGNOSIS — Z01812 Encounter for preprocedural laboratory examination: Secondary | ICD-10-CM | POA: Diagnosis present

## 2020-09-25 LAB — SARS CORONAVIRUS 2 (TAT 6-24 HRS): SARS Coronavirus 2: NEGATIVE

## 2020-09-28 ENCOUNTER — Ambulatory Visit (HOSPITAL_BASED_OUTPATIENT_CLINIC_OR_DEPARTMENT_OTHER): Payer: Medicare Other | Admitting: Certified Registered Nurse Anesthetist

## 2020-09-28 ENCOUNTER — Ambulatory Visit (HOSPITAL_COMMUNITY): Payer: Medicare Other

## 2020-09-28 ENCOUNTER — Encounter (HOSPITAL_BASED_OUTPATIENT_CLINIC_OR_DEPARTMENT_OTHER): Payer: Self-pay | Admitting: Orthopedic Surgery

## 2020-09-28 ENCOUNTER — Other Ambulatory Visit: Payer: Self-pay

## 2020-09-28 ENCOUNTER — Encounter (HOSPITAL_BASED_OUTPATIENT_CLINIC_OR_DEPARTMENT_OTHER)
Admission: RE | Disposition: A | Discharge status: Home / Self Care | Payer: Self-pay | Source: Home / Self Care | Attending: Orthopedic Surgery

## 2020-09-28 ENCOUNTER — Ambulatory Visit (HOSPITAL_BASED_OUTPATIENT_CLINIC_OR_DEPARTMENT_OTHER)
Admission: RE | Admit: 2020-09-28 | Discharge: 2020-09-28 | Disposition: A | Discharge status: Home / Self Care | Payer: Medicare Other | Attending: Orthopedic Surgery | Admitting: Orthopedic Surgery

## 2020-09-28 DIAGNOSIS — X58XXXA Exposure to other specified factors, initial encounter: Secondary | ICD-10-CM | POA: Insufficient documentation

## 2020-09-28 DIAGNOSIS — Z96652 Presence of left artificial knee joint: Secondary | ICD-10-CM | POA: Insufficient documentation

## 2020-09-28 DIAGNOSIS — Z0000 Encounter for general adult medical examination without abnormal findings: Secondary | ICD-10-CM

## 2020-09-28 DIAGNOSIS — M84351A Stress fracture, right femur, initial encounter for fracture: Secondary | ICD-10-CM | POA: Diagnosis not present

## 2020-09-28 DIAGNOSIS — M1990 Unspecified osteoarthritis, unspecified site: Secondary | ICD-10-CM | POA: Diagnosis not present

## 2020-09-28 DIAGNOSIS — G4730 Sleep apnea, unspecified: Secondary | ICD-10-CM | POA: Insufficient documentation

## 2020-09-28 DIAGNOSIS — Z86010 Personal history of colonic polyps: Secondary | ICD-10-CM | POA: Diagnosis not present

## 2020-09-28 DIAGNOSIS — Z825 Family history of asthma and other chronic lower respiratory diseases: Secondary | ICD-10-CM | POA: Diagnosis not present

## 2020-09-28 DIAGNOSIS — Z87891 Personal history of nicotine dependence: Secondary | ICD-10-CM | POA: Insufficient documentation

## 2020-09-28 DIAGNOSIS — Z79899 Other long term (current) drug therapy: Secondary | ICD-10-CM | POA: Diagnosis not present

## 2020-09-28 DIAGNOSIS — E669 Obesity, unspecified: Secondary | ICD-10-CM | POA: Diagnosis not present

## 2020-09-28 DIAGNOSIS — E785 Hyperlipidemia, unspecified: Secondary | ICD-10-CM | POA: Insufficient documentation

## 2020-09-28 DIAGNOSIS — S83241A Other tear of medial meniscus, current injury, right knee, initial encounter: Secondary | ICD-10-CM | POA: Diagnosis present

## 2020-09-28 DIAGNOSIS — Z6833 Body mass index (BMI) 33.0-33.9, adult: Secondary | ICD-10-CM | POA: Insufficient documentation

## 2020-09-28 DIAGNOSIS — E039 Hypothyroidism, unspecified: Secondary | ICD-10-CM | POA: Diagnosis not present

## 2020-09-28 DIAGNOSIS — S83231A Complex tear of medial meniscus, current injury, right knee, initial encounter: Secondary | ICD-10-CM | POA: Insufficient documentation

## 2020-09-28 DIAGNOSIS — M94261 Chondromalacia, right knee: Secondary | ICD-10-CM | POA: Diagnosis not present

## 2020-09-28 HISTORY — DX: Sleep apnea, unspecified: G47.30

## 2020-09-28 HISTORY — PX: KNEE ARTHROSCOPY WITH SUBCHONDROPLASTY: SHX6732

## 2020-09-28 HISTORY — DX: Hypothyroidism, unspecified: E03.9

## 2020-09-28 HISTORY — DX: Other tear of lateral meniscus, current injury, right knee, initial encounter: S83.281A

## 2020-09-28 SURGERY — ARTHROSCOPY, KNEE, WITH SUBCHONDROPLASTY
Anesthesia: General | Site: Knee | Laterality: Right

## 2020-09-28 MED ORDER — CEFAZOLIN SODIUM-DEXTROSE 2-4 GM/100ML-% IV SOLN
2.0000 g | INTRAVENOUS | Status: AC
  Administered 2020-09-28: 2 g via INTRAVENOUS

## 2020-09-28 MED ORDER — FENTANYL CITRATE (PF) 100 MCG/2ML IJ SOLN
100.0000 ug | Freq: Once | INTRAMUSCULAR | Status: AC
  Administered 2020-09-28: 100 ug via INTRAVENOUS

## 2020-09-28 MED ORDER — DEXAMETHASONE SODIUM PHOSPHATE 10 MG/ML IJ SOLN
INTRAMUSCULAR | Status: DC | PRN
  Administered 2020-09-28: 10 mg

## 2020-09-28 MED ORDER — ONDANSETRON HCL 4 MG/2ML IJ SOLN
INTRAMUSCULAR | Status: DC | PRN
  Administered 2020-09-28: 4 mg via INTRAVENOUS

## 2020-09-28 MED ORDER — FENTANYL CITRATE (PF) 100 MCG/2ML IJ SOLN
INTRAMUSCULAR | Status: AC
  Filled 2020-09-28: qty 2

## 2020-09-28 MED ORDER — DEXAMETHASONE SODIUM PHOSPHATE 10 MG/ML IJ SOLN
INTRAMUSCULAR | Status: DC | PRN
  Administered 2020-09-28: 10 mg via INTRAVENOUS

## 2020-09-28 MED ORDER — DEXAMETHASONE SODIUM PHOSPHATE 10 MG/ML IJ SOLN
INTRAMUSCULAR | Status: AC
  Filled 2020-09-28: qty 1

## 2020-09-28 MED ORDER — SODIUM CHLORIDE 0.9 % IR SOLN
Status: DC | PRN
  Administered 2020-09-28: 6000 mL

## 2020-09-28 MED ORDER — MIDAZOLAM HCL 2 MG/2ML IJ SOLN
INTRAMUSCULAR | Status: AC
  Filled 2020-09-28: qty 2

## 2020-09-28 MED ORDER — OXYCODONE HCL 5 MG/5ML PO SOLN: 5.0000 mg | Freq: Once | ORAL | Status: DC | PRN

## 2020-09-28 MED ORDER — OXYCODONE HCL 5 MG PO TABS: 5.0000 mg | ORAL_TABLET | ORAL | 0 refills | Status: AC | PRN

## 2020-09-28 MED ORDER — PROPOFOL 10 MG/ML IV BOLUS
INTRAVENOUS | Status: DC | PRN
  Administered 2020-09-28: 200 mg via INTRAVENOUS

## 2020-09-28 MED ORDER — LACTATED RINGERS IV SOLN: INTRAVENOUS | Status: DC

## 2020-09-28 MED ORDER — MEPERIDINE HCL 25 MG/ML IJ SOLN: 6.2500 mg | INTRAMUSCULAR | Status: DC | PRN

## 2020-09-28 MED ORDER — ROPIVACAINE HCL 5 MG/ML IJ SOLN
INTRAMUSCULAR | Status: DC | PRN
  Administered 2020-09-28 (×2): 5 mL via PERINEURAL

## 2020-09-28 MED ORDER — OXYCODONE HCL 5 MG PO TABS: 5.0000 mg | ORAL_TABLET | Freq: Once | ORAL | Status: DC | PRN

## 2020-09-28 MED ORDER — CEFAZOLIN SODIUM-DEXTROSE 2-4 GM/100ML-% IV SOLN
INTRAVENOUS | Status: AC
  Filled 2020-09-28: qty 100

## 2020-09-28 MED ORDER — ACETAMINOPHEN 160 MG/5ML PO SOLN: 325.0000 mg | ORAL | Status: DC | PRN

## 2020-09-28 MED ORDER — KETOROLAC TROMETHAMINE 30 MG/ML IJ SOLN: 30.0000 mg | Freq: Once | INTRAMUSCULAR | Status: DC | PRN

## 2020-09-28 MED ORDER — CLONIDINE HCL (ANALGESIA) 100 MCG/ML EP SOLN
EPIDURAL | Status: DC | PRN
  Administered 2020-09-28: 100 ug

## 2020-09-28 MED ORDER — FENTANYL CITRATE (PF) 100 MCG/2ML IJ SOLN
INTRAMUSCULAR | Status: DC | PRN
Start: 2020-09-28 — End: 2020-09-28
  Administered 2020-09-28 (×2): 50 ug via INTRAVENOUS

## 2020-09-28 MED ORDER — LIDOCAINE 2% (20 MG/ML) 5 ML SYRINGE
INTRAMUSCULAR | Status: DC | PRN
  Administered 2020-09-28: 100 mg via INTRAVENOUS

## 2020-09-28 MED ORDER — ONDANSETRON 4 MG PO TBDP: 4.0000 mg | ORAL_TABLET | Freq: Three times a day (TID) | ORAL | 0 refills | Status: DC | PRN

## 2020-09-28 MED ORDER — MIDAZOLAM HCL 2 MG/2ML IJ SOLN
2.0000 mg | Freq: Once | INTRAMUSCULAR | Status: AC
  Administered 2020-09-28: 2 mg via INTRAVENOUS

## 2020-09-28 MED ORDER — LIDOCAINE 2% (20 MG/ML) 5 ML SYRINGE
INTRAMUSCULAR | Status: AC
  Filled 2020-09-28: qty 5

## 2020-09-28 MED ORDER — ROPIVACAINE HCL 7.5 MG/ML IJ SOLN
INTRAMUSCULAR | Status: DC | PRN
  Administered 2020-09-28 (×4): 5 mL via PERINEURAL

## 2020-09-28 MED ORDER — ACETAMINOPHEN 325 MG PO TABS: 325.0000 mg | ORAL_TABLET | ORAL | Status: DC | PRN

## 2020-09-28 MED ORDER — FENTANYL CITRATE (PF) 100 MCG/2ML IJ SOLN: 25.0000 ug | INTRAMUSCULAR | Status: DC | PRN

## 2020-09-28 MED ORDER — ONDANSETRON HCL 4 MG/2ML IJ SOLN: 4.0000 mg | Freq: Once | INTRAMUSCULAR | Status: DC | PRN

## 2020-09-28 MED ORDER — ONDANSETRON HCL 4 MG/2ML IJ SOLN
INTRAMUSCULAR | Status: AC
  Filled 2020-09-28: qty 2

## 2020-09-28 SURGICAL SUPPLY — 37 items
BLADE SHAVER TORPEDO 4X13 (MISCELLANEOUS) ×3 IMPLANT
BNDG ELASTIC 6X5.8 VLCR STR LF (GAUZE/BANDAGES/DRESSINGS) ×3 IMPLANT
COVER MAYO STAND STRL (DRAPES) ×3 IMPLANT
COVER WAND RF STERILE (DRAPES) ×3 IMPLANT
CUFF TOURN SGL QUICK 34 (TOURNIQUET CUFF) ×2
CUFF TRNQT CYL 34X4.125X (TOURNIQUET CUFF) ×1 IMPLANT
DRAPE ARTHROSCOPY W/POUCH 114 (DRAPES) ×3 IMPLANT
DRAPE C-ARM 42X120 X-RAY (DRAPES) ×3 IMPLANT
DRAPE U-SHAPE 47X51 STRL (DRAPES) ×3 IMPLANT
DRSG PAD ABDOMINAL 8X10 ST (GAUZE/BANDAGES/DRESSINGS) ×3 IMPLANT
DURAPREP 26ML APPLICATOR (WOUND CARE) ×3 IMPLANT
GAUZE SPONGE 4X4 12PLY STRL (GAUZE/BANDAGES/DRESSINGS) ×3 IMPLANT
GAUZE XEROFORM 1X8 LF (GAUZE/BANDAGES/DRESSINGS) ×3 IMPLANT
GLOVE BIO SURGEON STRL SZ 6.5 (GLOVE) ×2 IMPLANT
GLOVE BIO SURGEON STRL SZ7.5 (GLOVE) ×6 IMPLANT
GLOVE BIO SURGEONS STRL SZ 6.5 (GLOVE) ×1
GLOVE BIOGEL PI IND STRL 7.0 (GLOVE) ×1 IMPLANT
GLOVE BIOGEL PI IND STRL 7.5 (GLOVE) ×1 IMPLANT
GLOVE BIOGEL PI INDICATOR 7.0 (GLOVE) ×2
GLOVE BIOGEL PI INDICATOR 7.5 (GLOVE) ×2
GLOVE INDICATOR 8.0 STRL GRN (GLOVE) ×6 IMPLANT
GLOVE SURG SS PI 7.0 STRL IVOR (GLOVE) ×3 IMPLANT
GOWN STRL REUS W/TWL XL LVL3 (GOWN DISPOSABLE) ×6 IMPLANT
IV NS IRRIG 3000ML ARTHROMATIC (IV SOLUTION) ×6 IMPLANT
KIT ACCUFILL 5CC (Knees) ×1 IMPLANT
KIT KNEE SCP 414.502 (Knees) ×3 IMPLANT
KIT TURNOVER CYSTO (KITS) ×3 IMPLANT
MANIFOLD NEPTUNE II (INSTRUMENTS) ×3 IMPLANT
NS IRRIG 500ML POUR BTL (IV SOLUTION) ×3 IMPLANT
PACK ARTHROSCOPY DSU (CUSTOM PROCEDURE TRAY) ×3 IMPLANT
PACK BASIN DAY SURGERY FS (CUSTOM PROCEDURE TRAY) ×3 IMPLANT
SUT ETHILON 4 0 PS 2 18 (SUTURE) ×3 IMPLANT
TOWEL OR 17X26 10 PK STRL BLUE (TOWEL DISPOSABLE) ×3 IMPLANT
TUBE CONNECTING 12'X1/4 (SUCTIONS) ×1
TUBE CONNECTING 12X1/4 (SUCTIONS) ×2 IMPLANT
TUBING ARTHROSCOPY IRRIG 16FT (MISCELLANEOUS) ×3 IMPLANT
WRAP KNEE MAXI GEL POST OP (GAUZE/BANDAGES/DRESSINGS) ×3 IMPLANT

## 2020-09-28 NOTE — Anesthesia Procedure Notes (Signed)
Anesthesia Regional Block: Adductor canal block   Pre-Anesthetic Checklist: ,, timeout performed, Correct Patient, Correct Site, Correct Laterality, Correct Procedure, Correct Position, site marked, Risks and benefits discussed,  Surgical consent,  Pre-op evaluation,  At surgeon's request and post-op pain management  Laterality: Lower and Right  Prep: chloraprep       Needles:  Injection technique: Single-shot  Needle Type: Echogenic Stimulator Needle     Needle Length: 9cm  Needle Gauge: 20   Needle insertion depth: 2 cm   Additional Needles:   Procedures:,,,, ultrasound used (permanent image in chart),,,,  Narrative:  Start time: 09/28/2020 11:40 AM End time: 09/28/2020 11:50 AM Injection made incrementally with aspirations every 5 mL.  Performed by: Personally  Anesthesiologist: Leilani Able, MD

## 2020-09-28 NOTE — Transfer of Care (Signed)
Immediate Anesthesia Transfer of Care Note  Patient: Cordaro Mukai  Procedure(s) Performed: RIGHT KNEE ARTHROSCOPY WITH PARTIAL MEDIAL MENISCECTOMY  AND MEDIAL FEMUR SUBCHONDROPLASTY (Right Knee)  Patient Location: PACU  Anesthesia Type:GA combined with regional for post-op pain  Level of Consciousness: awake, alert  and oriented  Airway & Oxygen Therapy: Patient Spontanous Breathing and Patient connected to face mask oxygen  Post-op Assessment: Report given to RN and Post -op Vital signs reviewed and stable  Post vital signs: Reviewed and stable  Last Vitals:  Vitals Value Taken Time  BP 120/82 09/28/20 1345  Temp    Pulse 66 09/28/20 1347  Resp 9 09/28/20 1347  SpO2 97 % 09/28/20 1347  Vitals shown include unvalidated device data.  Last Pain:  Vitals:   09/28/20 1057  TempSrc: Oral  PainSc: 1       Patients Stated Pain Goal: 5 (09/28/20 1057)  Complications: No complications documented.

## 2020-09-28 NOTE — H&P (Signed)
ORTHOPAEDIC H and P  REQUESTING PHYSICIAN: Yolonda Kida, MD  PCP:  Waldon Merl, PA-C  Chief Complaint: Right knee pain  HPI: Eric Kerr is a 69 y.o. male who complains of recalcitrant right knee pain.  He has had exhaustive conservative treatment.  He is here today for arthroscopic partial medial meniscectomy with medial femur subchondroplasty.  No new complaints at this time.  We have previously discussed this procedure in detail in the office.  Past Medical History:  Diagnosis Date  . Acute lateral meniscus tear of right knee   . Allergy   . Arthritis   . Colon polyps   . History of chickenpox as child  . Hyperlipidemia   . Hypothyroidism   . Sleep apnea    could not tolerate cpap mild osa   Past Surgical History:  Procedure Laterality Date  . left shoulder arthroscopy    . MENISCUS REPAIR Right 03/2018  . 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 right shoulder tear repaired  yrs ago  . TONSILLECTOMY AND ADENOIDECTOMY  as child   Social History   Socioeconomic History  . Marital status: Married    Spouse name: Not on file  . Number of children: Not on file  . Years of education: Not on file  . Highest education level: Not on file  Occupational History  . Not on file  Tobacco Use  . Smoking status: Former Smoker    Packs/day: 0.25    Years: 10.00    Pack years: 2.50    Types: Cigarettes    Quit date: 2010    Years since quitting: 11.7  . Smokeless tobacco: Former Neurosurgeon    Types: Engineer, drilling  . Vaping Use: Never used  Substance and Sexual Activity  . Alcohol use: Yes    Comment: 1 to 2 beers per day  . Drug use: Not Currently  . Sexual activity: Not Currently  Other Topics Concern  . Not on file  Social History Narrative  . Not on file   Social Determinants of Health   Financial Resource Strain:   . Difficulty of Paying Living Expenses: Not on file  Food Insecurity:   . Worried About Brewing technologist in the Last Year: Not on file  . Ran Out of Food in the Last Year: Not on file  Transportation Needs:   . Lack of Transportation (Medical): Not on file  . Lack of Transportation (Non-Medical): Not on file  Physical Activity:   . Days of Exercise per Week: Not on file  . Minutes of Exercise per Session: Not on file  Stress:   . Feeling of Stress : Not on file  Social Connections:   . Frequency of Communication with Friends and Family: Not on file  . Frequency of Social Gatherings with Friends and Family: Not on file  . Attends Religious Services: Not on file  . Active Member of Clubs or Organizations: Not on file  . Attends Banker Meetings: Not on file  . Marital Status: Not on file   Family History  Problem Relation Age of Onset  . COPD Mother    No Known Allergies Prior to Admission medications   Medication Sig Start Date End Date Taking? Authorizing Provider  Ascorbic Acid (VITAMIN C) 1000 MG tablet Take 1,000 mg by mouth daily.   Yes [provider]  ferrous sulfate 325 (65 FE) MG EC tablet Take  325 mg by mouth daily with breakfast.   Yes [provider]  Glucosamine-Chondroit-Vit C-Mn (GLUCOSAMINE CHONDR 1500 COMPLX) CAPS Take 1 capsule by mouth in the morning and at bedtime.    Yes [provider]  levothyroxine (SYNTHROID) 75 MCG tablet Take 1 tablet (75 mcg total) by mouth daily. 06/14/20  Yes Waldon Merl, PA-C  Multiple Vitamins-Minerals (MULTIVITAMIN ADULTS 50+) TABS Take 1 tablet by mouth daily.   Yes [provider]  Omega-3 Fatty Acids (FISH OIL TRIPLE STRENGTH) 1400 MG CAPS Take 1 capsule by mouth daily.   Yes [provider]  saw palmetto (V-R SAW PALMETTO) 160 MG capsule Take 450 mg by mouth 2 (two) times daily.    Yes [provider]  simvastatin (ZOCOR) 20 MG tablet Take 1 tablet by mouth once daily Patient taking differently: at bedtime.  08/25/20  Yes Waldon Merl, PA-C   No results  found.  Positive ROS: All other systems have been reviewed and were otherwise negative with the exception of those mentioned in the HPI and as above.  Physical Exam: General: Alert, no acute distress Cardiovascular: No pedal edema Respiratory: No cyanosis, no use of accessory musculature GI: No organomegaly, abdomen is soft and non-tender Skin: No lesions in the area of chief complaint Neurologic: Sensation intact distally Psychiatric: Patient is competent for consent with normal mood and affect Lymphatic: No axillary or cervical lymphadenopathy  MUSCULOSKELETAL:  Right leg is clean, dry, and intact.  No open wounds.  Neurovascularly intact.  Assessment: 1.  Right knee medial meniscus tear, acute 2.  Right knee chondromalacia medial compartment 3.  Right knee medial femoral condyle stress fracture  Plan: -Plan is to proceed today with arthroscopic intervention on the right knee.  This will include arthroscopic partial medial meniscectomy, chondroplasty, and percutaneous fixation of medial femur condyle stress fracture.  -We again discussed the risk and benefits of the procedure including but not limited to bleeding, infection, damage to surrounding nerves and vessels, stiffness, failure of pain relief, progression of arthritis, DVT, and the risk of anesthesia.  He has provided informed consent.  -He will discharge home postoperatively from PACU.    Yolonda Kida, MD Cell [phone]    09/28/2020 12:29 PM

## 2020-09-28 NOTE — Op Note (Signed)
Surgeon(s): Yolonda Kida, MD  Assistant: Dion Saucier, PA-C.   Attestation:  PA Sharon Seller was used throughout the procedure for positioning patient, implanting the device and closing wounds and transporting the patient to the PACU.  ANESTHESIA:  general, and regional   FLUIDS: Per anesthesia record.    ESTIMATED BLOOD LOSS: minimal     PREOPERATIVE DIAGNOSES:  1. Right knee medial meniscus tear 2.  Right medial femoral condyle insufficiency fracture 3.   Right medial femoral condyle and medial plateau chondromalacia   POSTOPERATIVE DIAGNOSES:  same   PROCEDURES PERFORMED:  1. Right knee arthroscopically aided treatment of medial femoral condyle insufficiency fracture with percutaneous internal fixation (subchondroplasty) (17494) 2. Right knee arthroscopy with arthroscopic partial medial meniscectomy 3.  Chondroplasty, right medial femoral condyle and medial tibial plateau   Implant: Flowable calcium phosphate, 5 mL. Zimmer   DESCRIPTION OF PROCEDURE: The patient has a right knee medial meniscus tear. They have had pain that has been refractory to conservative management. Their preoperative MRI demonstrated subchondral bone marrow edema and insufficiency fractures of the medial femoral condyle as well as the medial meniscus tear. Plans are to proceed with partial medial meniscectomy, internal fixation of subchondral insufficiency fractures with flowable calcium phosphate, and diagnostic arthroscopy with debridement as indicated. Full discussion held regarding risks benefits alternatives and complications related surgical intervention. Conservative care options reviewed. All questions answered.   The patient was identified in the preoperative holding area and the operative extremity was marked. The patient was brought to the operating room and transferred to operating table in a supine position. Satisfactory general anesthesia was induced by anesthesiology.     Standard  anterolateral, anteromedial arthroscopy portals were obtained. The anteromedial portal was obtained with a spinal needle for localization under direct visualization with subsequent diagnostic findings.    Anteromedial and anterolateral chambers: mild synovitis. The synovitis was debrided with a 4.5 mm full radius shaver through both the anteromedial and lateral portals.    Suprapatellar pouch and gutters: mild synovitis or debris. Patella chondral surface: Grade 2 Trochlear chondral surface: Grade 1 Patellofemoral tracking: level Medial meniscus: posterior horn and mid body complex degenerative tearing. Radial and horizontal components Medial femoral condyle flexion bearing surface: Grade 3 Medial femoral condyle extension bearing surface: Grade 3 Medial tibial plateau: Grade 2 Anterior cruciate ligament:stable Posterior cruciate ligament:stable Lateral meniscus: no tear.   Lateral femoral condyle flexion bearing surface: Grade 0 Lateral femoral condyle extension bearing surface: Grade 0 Lateral tibial plateau: Grade 2   Medial meniscus tear was debrided using biters and motorized shaver alternating until a stable remnant was left. Upon completion the probe was used to evaluate and assess the remaining meniscus which was gleaned to be stable.   Chondroplasty was achieved on the medial femoral condyle using a motorized shaver to debride the grade 3 unstable cartilage. Completion of the chondroplasty left A medial femoral condyle with smooth stable surface. There was no full-thickness component noted.   Next we turned our attention to the internal fixation of the medial femoral condyle. Arthroscopically we evaluated medial condyle noted there was no loose cartilage or debris surrounding the lesion and the fracture did not propagate to the joint surface. Using preoperative MRI we targeted the delivery device to just under the subchondral density and in the subchondral lesion. This was achieved  with intraoperative fluoroscopy. Once accurate placement was noted on 2 views and confirmed we delivered 3 mL of flowable calcium phosphate into the medial femoral condyle lesion. We  left the cannulas in place for approximately 8 minutes while the implant hardened. We removed the cannulas and again took 2 views of fluoroscopic pictures to confirm there was no extravasation outside of the bone, or into the joint. There was none noted.   After completion of synovectomy, diagnostic exam, and debridements as described, all compartments were checked and no residual debris remained. The portals were approximated with nylon suture. All excess fluid was expressed from the joint.  Xeroform sterile gauze dressings were applied followed by Ace bandage and ice pack.    There were no immediate competitions and all counts were correct.   DISPOSITION: The patient was awakened from general anesthetic, extubated, taken to the recovery room in medically stable condition, no apparent complications. The patient may be weightbearing as tolerated to the operative lower extremity with crutches.  Range of motion of right knee as tolerated.  They will use bid asa for DVT ppx, and return in 2 weeks for suture removal.   Yolonda Kida

## 2020-09-28 NOTE — Anesthesia Preprocedure Evaluation (Signed)
Anesthesia Evaluation  Patient identified by MRN, date of birth, ID band Patient awake    Reviewed: Allergy & Precautions, NPO status , Patient's Chart, lab work & pertinent test results  Airway Mallampati: I       Dental no notable dental hx.    Pulmonary sleep apnea , former smoker,    Pulmonary exam normal        Cardiovascular negative cardio ROS Normal cardiovascular exam     Neuro/Psych negative neurological ROS  negative psych ROS   GI/Hepatic Neg liver ROS,   Endo/Other  Hypothyroidism   Renal/GU negative Renal ROS  negative genitourinary   Musculoskeletal   Abdominal (+) + obese,   Peds  Hematology negative hematology ROS (+)   Anesthesia Other Findings   Reproductive/Obstetrics                             Anesthesia Physical Anesthesia Plan  ASA: II  Anesthesia Plan: General   Post-op Pain Management:  Regional for Post-op pain   Induction: Intravenous  PONV Risk Score and Plan: 2 and Ondansetron, Dexamethasone and Midazolam  Airway Management Planned: LMA  Additional Equipment: None  Intra-op Plan:   Post-operative Plan: Extubation in OR  Informed Consent: I have reviewed the patients History and Physical, chart, labs and discussed the procedure including the risks, benefits and alternatives for the proposed anesthesia with the patient or authorized representative who has indicated his/her understanding and acceptance.     Dental advisory given  Plan Discussed with: CRNA  Anesthesia Plan Comments:         Anesthesia Quick Evaluation

## 2020-09-28 NOTE — Progress Notes (Signed)
Assisted Dr. Hatchett with right, ultrasound guided, adductor canal block. Side rails up, monitors on throughout procedure. See vital signs in flow sheet. Tolerated Procedure well.  

## 2020-09-28 NOTE — Brief Op Note (Signed)
09/28/2020  1:21 PM  PATIENT:  Eric Kerr  69 y.o. male  PRE-OPERATIVE DIAGNOSIS:  Right knee medial femur stress fracture, medial meniscus tear  POST-OPERATIVE DIAGNOSIS:  Right knee medial femur stress fracture, medial meniscus tear  PROCEDURE:  Procedure(s) with comments: RIGHT KNEE ARTHROSCOPY WITH PARTIAL MEDIAL MENISCECTOMY  AND MEDIAL FEMUR SUBCHONDROPLASTY (Right) -  SURGEON:  Surgeon(s) and Role:    * Yolonda Kida, MD - Primary  PHYSICIAN ASSISTANT: Dion Saucier, PA-C  ANESTHESIA:   regional and general  EBL:  5 cc  BLOOD ADMINISTERED:none  DRAINS: none   LOCAL MEDICATIONS USED:  NONE  SPECIMEN:  No Specimen  DISPOSITION OF SPECIMEN:  N/A  COUNTS:  YES  TOURNIQUET:  * Missing tourniquet times found for documented tourniquets in log: [id number] *  DICTATION: .Note written in EPIC  PLAN OF CARE: Discharge to home after PACU  PATIENT DISPOSITION:  PACU - hemodynamically stable.   Delay start of Pharmacological VTE agent (>24hrs) due to surgical blood loss or risk of bleeding: not applicable

## 2020-09-28 NOTE — Discharge Instructions (Signed)
-He may move the knee as tolerated and you may also put weight on the right leg as tolerated postoperatively.  -You may remove your postoperative bandages in 2 days.  You can shower at that time.  Please do not submerge underwater.  -For mild to moderate pain use Tylenol and Advil around-the-clock.  For any breakthrough pain use oxycodone as necessary.  -You should apply ice to the right knee for 30 minutes out of each hour that you are awake.  To do this as frequently as possible around-the-clock.  -For the prevention of blood clots please take an 81 mg aspirin once per day for 6 weeks.  -Return to see Dr. Aundria Rud in 2 weeks for routine postop care.   Regional Anesthesia Blocks  1. Numbness or the inability to move the "blocked" extremity may last from 3-48 hours after placement. The length of time depends on the medication injected and your individual response to the medication. If the numbness is not going away after 48 hours, call your surgeon.  2. The extremity that is blocked will need to be protected until the numbness is gone and the  Strength has returned. Because you cannot feel it, you will need to take extra care to avoid injury. Because it may be weak, you may have difficulty moving it or using it. You may not know what position it is in without looking at it while the block is in effect.  3. For blocks in the legs and feet, returning to weight bearing and walking needs to be done carefully. You will need to wait until the numbness is entirely gone and the strength has returned. You should be able to move your leg and foot normally before you try and bear weight or walk. You will need someone to be with you when you first try to ensure you do not fall and possibly risk injury.  4. Bruising and tenderness at the needle site are common side effects and will resolve in a few days.  5. Persistent numbness or new problems with movement should be communicated to the surgeon or the [facility] [phone] [facility] [phone]).   Post Anesthesia Home Care Instructions  Activity: Get plenty of rest for the remainder of the day. A responsible individual must stay with you for 24 hours following the procedure. e For the next 24 hours, DO NOT: -Drive a car -Advertising copywriter -Drink alcoholic beverages -Take any medication unless instructed by your physician -Make any legal decisions or sign important papers.  Meals: Start with liquid foods such as gelatin or soup. Progress to regular foods as tolerated. Avoid greasy, spicy, heavy foods. If nausea and/or vomiting occur, drink only clear liquids until the nausea and/or vomiting subsides. Call your physician if vomiting continues.  Special Instructions/Symptoms: Your throat may feel dry or sore from the anesthesia or the breathing tube placed in your throat during surgery. If this causes discomfort, gargle with warm salt water. The discomfort should disappear within 24 hours.  If you had a scopolamine patch placed behind your ear for the management of post- operative nausea and/or vomiting:  1. The medication in the patch is effective for 72 hours, after which it should be removed.  Wrap patch in a tissue and discard in the trash. Wash hands thoroughly with soap and water. 2. You may remove the patch earlier than 72 hours if you experience unpleasant side effects which may include dry mouth, dizziness or visual disturbances. 3.  Avoid touching the patch. Wash your hands with soap and water after contact with the patch.

## 2020-09-28 NOTE — Anesthesia Procedure Notes (Signed)
Procedure Name: LMA Insertion Date/Time: 09/28/2020 12:40 PM Performed by: Pearson Grippe, CRNA Pre-anesthesia Checklist: Patient identified, Emergency Drugs available, Suction available and Patient being monitored Patient Re-evaluated:Patient Re-evaluated prior to induction Oxygen Delivery Method: Circle system utilized Preoxygenation: Pre-oxygenation with 100% oxygen Induction Type: IV induction Ventilation: Mask ventilation without difficulty LMA: LMA inserted LMA Size: 4.0 Number of attempts: 1 Airway Equipment and Method: Bite block Placement Confirmation: positive ETCO2 Tube secured with: Tape Dental Injury: Teeth and Oropharynx as per pre-operative assessment

## 2020-09-28 NOTE — Anesthesia Postprocedure Evaluation (Signed)
Anesthesia Post Note  Patient: Eric Kerr  Procedure(s) Performed: RIGHT KNEE ARTHROSCOPY WITH PARTIAL MEDIAL MENISCECTOMY  AND MEDIAL FEMUR SUBCHONDROPLASTY (Right Knee)     Patient location during evaluation: PACU Anesthesia Type: General Level of consciousness: awake Pain management: pain level controlled Vital Signs Assessment: post-procedure vital signs reviewed and stable Respiratory status: spontaneous breathing Cardiovascular status: stable Postop Assessment: no apparent nausea or vomiting and patient able to bend at knees Anesthetic complications: no   No complications documented.  Last Vitals:  Vitals:   09/28/20 1400 09/28/20 1415  BP: 117/87 120/81  Pulse: 63 62  Resp: 11 (!) 7  Temp:    SpO2: 100% 96%    Last Pain:  Vitals:   09/28/20 1415  TempSrc:   PainSc: 0-No pain                 Caren Macadam

## 2020-09-29 ENCOUNTER — Encounter (HOSPITAL_BASED_OUTPATIENT_CLINIC_OR_DEPARTMENT_OTHER): Payer: Self-pay | Admitting: Orthopedic Surgery

## 2020-10-17 IMAGING — RF DG KNEE 1-2V*R*
1 series · 5 of 5 positions shown · non-contrast
Comparison: None.

CLINICAL DATA: Right knee subchondroplasty.

EXAM:
RIGHT KNEE - 1-2 VIEW

[Series 1: run · 5 of 5 slices shown]
[im 1/5]
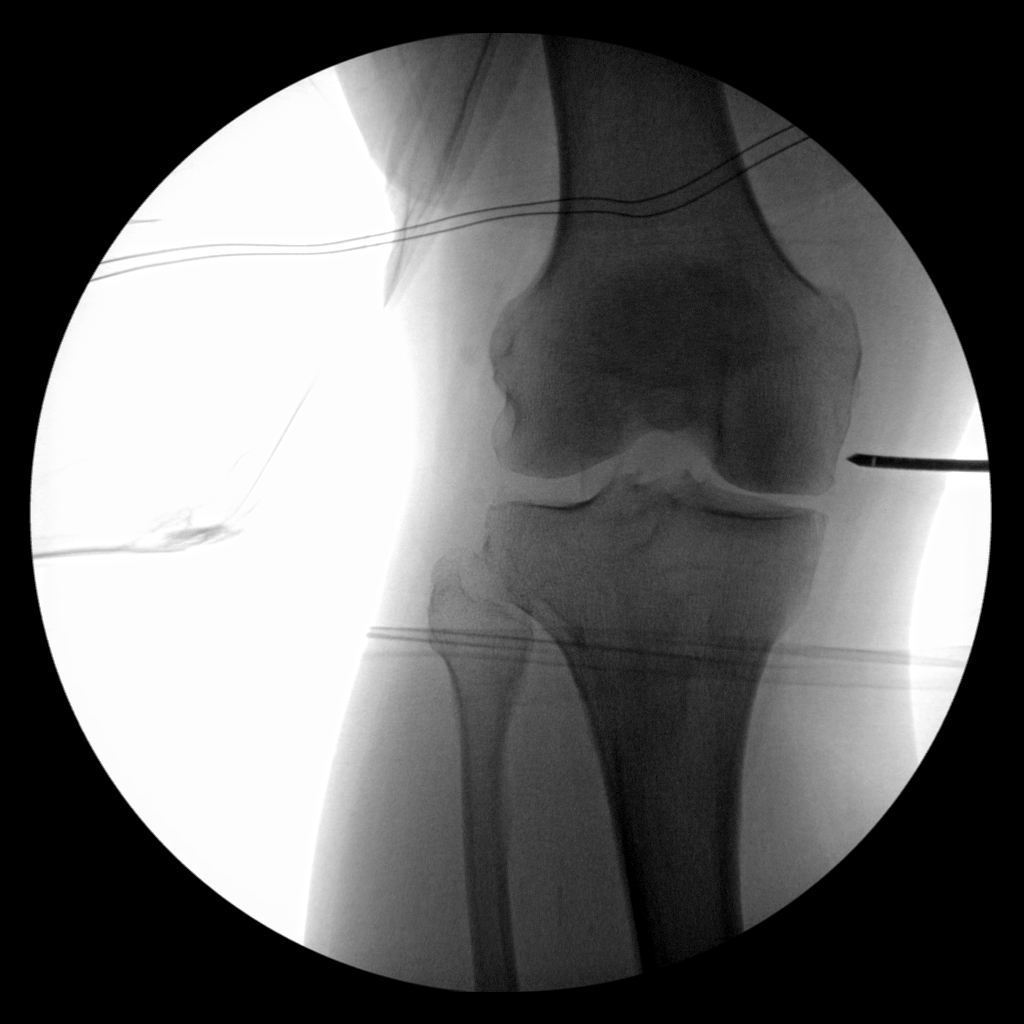
[im 2/5]
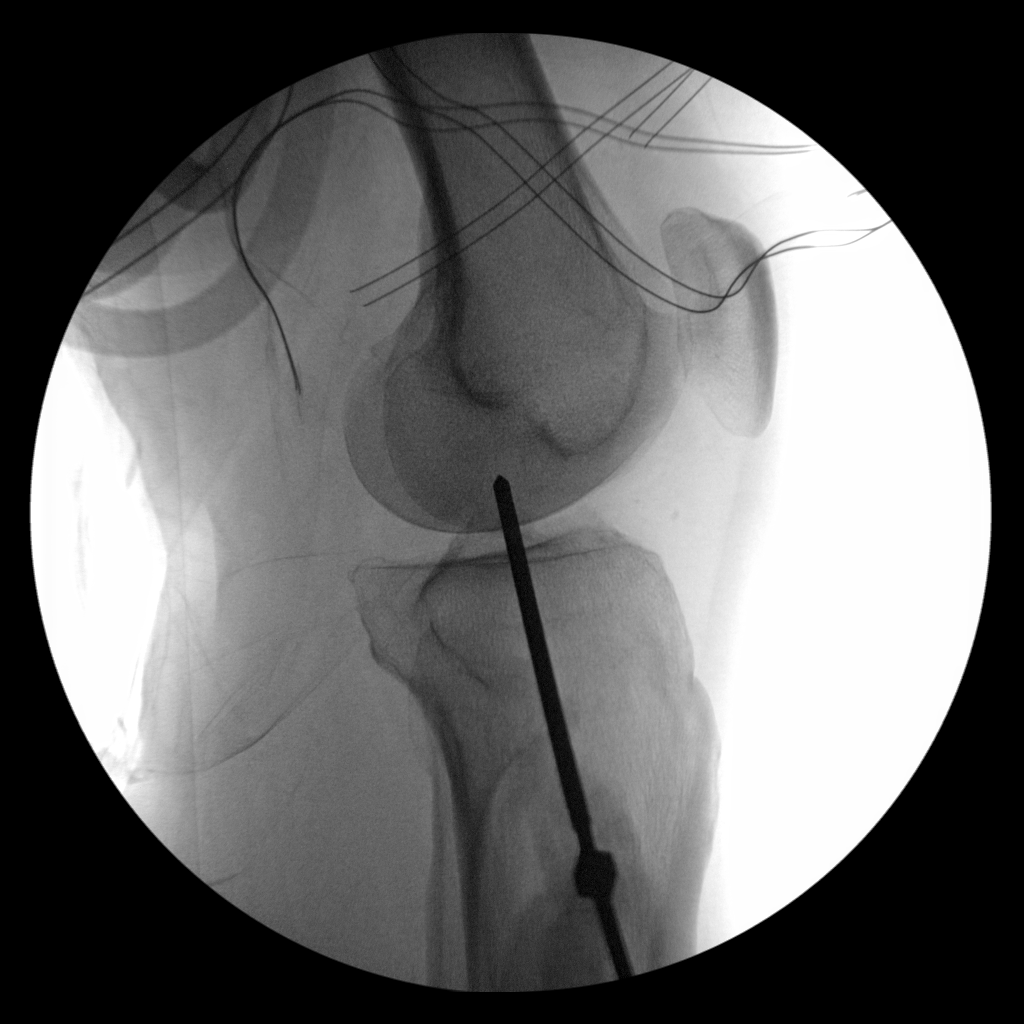
[im 3/5]
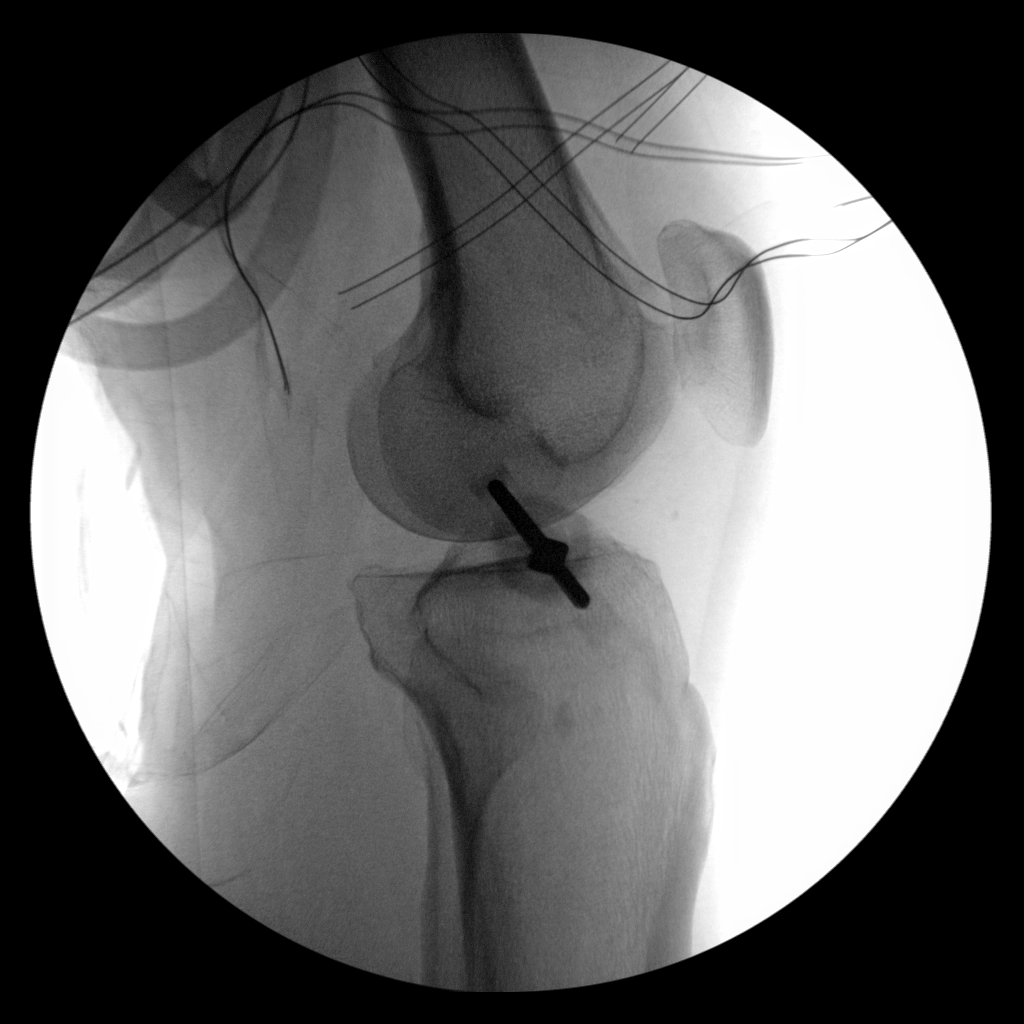
[im 4/5]
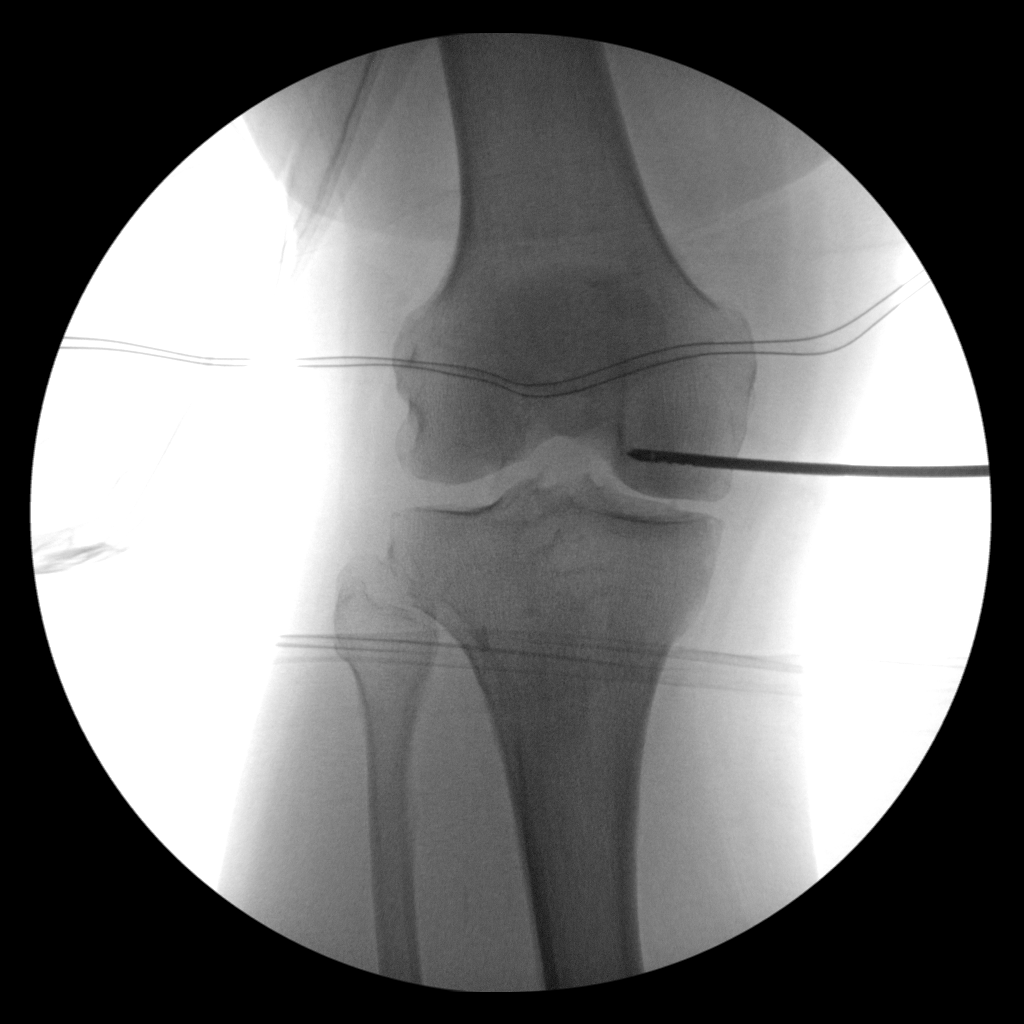
[im 5/5]
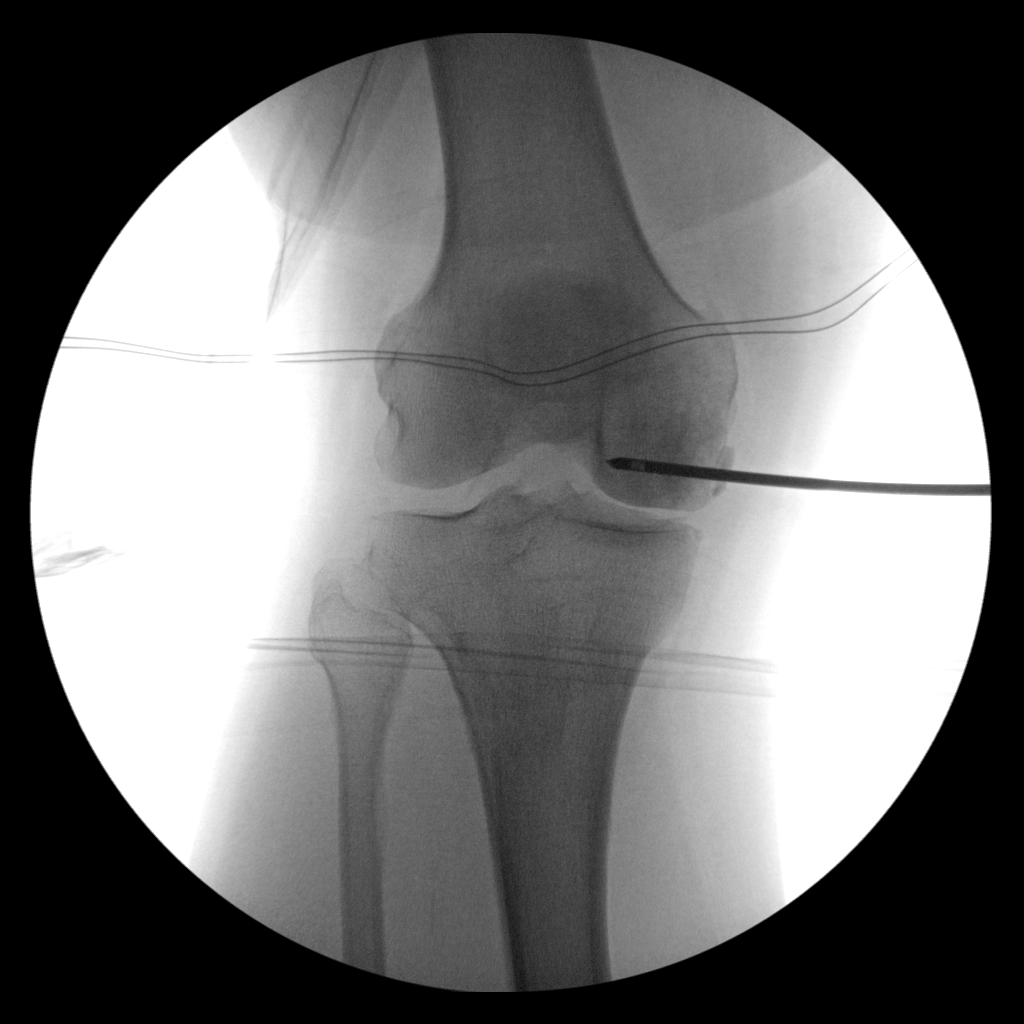

[5 of 5 positions shown; findings below may reference images not displayed]

FINDINGS: Five intraoperative fluoroscopic radiographs of the reported right
knee demonstrate a metallic cannula advanced into the medial femoral
condyle with application of a small disc like radiodensity to the
medial cortex of the medial femoral condyle on final image. There is
increased density within the a medial femoral condyle itself likely
related to reported infusion flowable calcium phosphate. Mild
asymmetric medial compartment joint space narrowing in keeping with
mild degenerative arthritis in this compartment. No unexpected
fracture or dislocation.

FLUOROSCOPY TIME:  13 seconds
IMPRESSION: Intraoperative changes of right medial femoral condyle
subchondroplasty

## 2020-11-05 ENCOUNTER — Other Ambulatory Visit: Payer: Self-pay

## 2020-11-05 ENCOUNTER — Ambulatory Visit (INDEPENDENT_AMBULATORY_CARE_PROVIDER_SITE_OTHER): Payer: Medicare Other | Admitting: Physician Assistant

## 2020-11-05 ENCOUNTER — Encounter: Payer: Self-pay | Admitting: Physician Assistant

## 2020-11-05 VITALS — BP 128/84 | HR 63 | Temp 98.3°F | Resp 16 | Ht 67.5 in | Wt 211.0 lb

## 2020-11-05 DIAGNOSIS — E7800 Pure hypercholesterolemia, unspecified: Secondary | ICD-10-CM

## 2020-11-05 DIAGNOSIS — E039 Hypothyroidism, unspecified: Secondary | ICD-10-CM | POA: Diagnosis not present

## 2020-11-05 DIAGNOSIS — Z23 Encounter for immunization: Secondary | ICD-10-CM

## 2020-11-05 DIAGNOSIS — Z125 Encounter for screening for malignant neoplasm of prostate: Secondary | ICD-10-CM

## 2020-11-05 DIAGNOSIS — Z0000 Encounter for general adult medical examination without abnormal findings: Secondary | ICD-10-CM | POA: Diagnosis not present

## 2020-11-05 LAB — PSA, MEDICARE: PSA: 1.75 ng/ml (ref 0.10–4.00)

## 2020-11-05 LAB — LIPID PANEL
Cholesterol: 155 mg/dL (ref 0–200)
HDL: 60.8 mg/dL (ref >39.00)
LDL Cholesterol: 78 mg/dL (ref 0–99)
NonHDL: 94.18
Total CHOL/HDL Ratio: 3
Triglycerides: 81 mg/dL (ref 0.0–149.0)
VLDL: 16.2 mg/dL (ref 0.0–40.0)

## 2020-11-05 LAB — COMPREHENSIVE METABOLIC PANEL
ALT: 22 U/L (ref 0–53)
AST: 22 U/L (ref 0–37)
Albumin: 4.3 g/dL (ref 3.5–5.2)
Alkaline Phosphatase: 80 U/L (ref 39–117)
BUN: 12 mg/dL (ref 6–23)
CO2: 34 mEq/L — ABNORMAL HIGH (ref 19–32)
Calcium: 10.3 mg/dL (ref 8.4–10.5)
Chloride: 100 mEq/L (ref 96–112)
Creatinine, Ser: 1.12 mg/dL (ref 0.40–1.50)
GFR: 67.84 mL/min (ref >60.00)
Glucose, Bld: 92 mg/dL (ref 70–99)
Potassium: 5.2 mEq/L — ABNORMAL HIGH (ref 3.5–5.1)
Sodium: 139 mEq/L (ref 135–145)
Total Bilirubin: 0.7 mg/dL (ref 0.2–1.2)
Total Protein: 7.2 g/dL (ref 6.0–8.3)

## 2020-11-05 LAB — TSH: TSH: 2.7 u[IU]/mL (ref 0.35–4.50)

## 2020-11-05 NOTE — Patient Instructions (Signed)
Please go to the lab for blood work.   Our office will call you with your results unless you have chosen to receive results via MyChart.  If your blood work is normal we will follow-up each year for physicals and as scheduled for chronic medical problems.  If anything is abnormal we will treat accordingly and get you in for a follow-up.   Preventive Care 7 Years and Older, Male Preventive care refers to lifestyle choices and visits with your health care provider that can promote health and wellness. This includes:  A yearly physical exam. This is also called an annual well check.  Regular dental and eye exams.  Immunizations.  Screening for certain conditions.  Healthy lifestyle choices, such as diet and exercise. What can I expect for my preventive care visit? Physical exam Your health care provider will check:  Height and weight. These may be used to calculate body mass index (BMI), which is a measurement that tells if you are at a healthy weight.  Heart rate and blood pressure.  Your skin for abnormal spots. Counseling Your health care provider may ask you questions about:  Alcohol, tobacco, and drug use.  Emotional well-being.  Home and relationship well-being.  Sexual activity.  Eating habits.  History of falls.  Memory and ability to understand (cognition).  Work and work Statistician. What immunizations do I need?  Influenza (flu) vaccine  This is recommended every year. Tetanus, diphtheria, and pertussis (Tdap) vaccine  You may need a Td booster every 10 years. Varicella (chickenpox) vaccine  You may need this vaccine if you have not already been vaccinated. Zoster (shingles) vaccine  You may need this after age 67. Pneumococcal conjugate (PCV13) vaccine  One dose is recommended after age 67. Pneumococcal polysaccharide (PPSV23) vaccine  One dose is recommended after age 67. Measles, mumps, and rubella (MMR) vaccine  You may need at least  one dose of MMR if you were born in 1957 or later. You may also need a second dose. Meningococcal conjugate (MenACWY) vaccine  You may need this if you have certain conditions. Hepatitis A vaccine  You may need this if you have certain conditions or if you travel or work in places where you may be exposed to hepatitis A. Hepatitis B vaccine  You may need this if you have certain conditions or if you travel or work in places where you may be exposed to hepatitis B. Haemophilus influenzae type b (Hib) vaccine  You may need this if you have certain conditions. You may receive vaccines as individual doses or as more than one vaccine together in one shot (combination vaccines). Talk with your health care provider about the risks and benefits of combination vaccines. What tests do I need? Blood tests  Lipid and cholesterol levels. These may be checked every 5 years, or more frequently depending on your overall health.  Hepatitis C test.  Hepatitis B test. Screening  Lung cancer screening. You may have this screening every year starting at age 67 if you have a 30-pack-year history of smoking and currently smoke or have quit within the past 15 years.  Colorectal cancer screening. All adults should have this screening starting at age 67 and continuing until age 23. Your health care provider may recommend screening at age 67 if you are at increased risk. You will have tests every 1-10 years, depending on your results and the type of screening test.  Prostate cancer screening. Recommendations will vary depending on your family history  and other risks.  Diabetes screening. This is done by checking your blood sugar (glucose) after you have not eaten for a while (fasting). You may have this done every 1-3 years.  Abdominal aortic aneurysm (AAA) screening. You may need this if you are a current or former smoker.  Sexually transmitted disease (STD) testing. Follow these instructions at  home: Eating and drinking  Eat a diet that includes fresh fruits and vegetables, whole grains, lean protein, and low-fat dairy products. Limit your intake of foods with high amounts of sugar, saturated fats, and salt.  Take vitamin and mineral supplements as recommended by your health care provider.  Do not drink alcohol if your health care provider tells you not to drink.  If you drink alcohol: ? Limit how much you have to 0-2 drinks a day. ? Be aware of how much alcohol is in your drink. In the [place] one drink equals one 12 oz bottle of beer (355 mL), one 5 oz glass of wine (148 mL), or one 1 oz glass of hard liquor (44 mL). Lifestyle  Take daily care of your teeth and gums.  Stay active. Exercise for at least 30 minutes on 5 or more days each week.  Do not use any products that contain nicotine or tobacco, such as cigarettes, e-cigarettes, and chewing tobacco. If you need help quitting, ask your health care provider.  If you are sexually active, practice safe sex. Use a condom or other form of protection to prevent STIs (sexually transmitted infections).  Talk with your health care provider about taking a low-dose aspirin or statin. What's next?  Visit your health care provider once a year for a well check visit.  Ask your health care provider how often you should have your eyes and teeth checked.  Stay up to date on all vaccines. This information is not intended to replace advice given to you by your health care provider. Make sure you discuss any questions you have with your health care provider. Document Revised: 11/28/2018 Document Reviewed: 11/28/2018 Elsevier Patient Education  [employer]. .

## 2020-11-05 NOTE — Progress Notes (Signed)
Subjective:   Eric Kerr is a 69 y.o. male who presents for Medicare Annual/Subsequent preventive examination.  Review of Systems    Review of Systems  Constitutional: Negative for fever and weight loss.  HENT: Negative for ear discharge, ear pain, hearing loss and tinnitus.   Eyes: Negative for blurred vision, double vision, photophobia and pain.  Respiratory: Negative for cough and shortness of breath.   Cardiovascular: Negative for chest pain and palpitations.  Gastrointestinal: Negative for abdominal pain, blood in stool, constipation, diarrhea, heartburn, melena, nausea and vomiting.  Genitourinary: Negative for dysuria, flank pain, frequency, hematuria and urgency.  Musculoskeletal: Negative for falls.  Neurological: Negative for dizziness, loss of consciousness and headaches.  Endo/Heme/Allergies: Negative for environmental allergies.  Psychiatric/Behavioral: Negative for depression, hallucinations, substance abuse and suicidal ideas. The patient is not nervous/anxious and does not have insomnia.     Objective:    Today's Vitals   11/05/20 0930 11/05/20 0931  BP:  128/84  Pulse:  63  Resp: 16 16  Temp:  98.3 F (36.8 C)  TempSrc:  Temporal  SpO2:  98%  Weight: 211 lb (95.7 kg) 211 lb (95.7 kg)  Height: 5' 7.5" (1.715 m) 5' 7.5" (1.715 m)  PainSc:  3    Body mass index is 32.56 kg/m.  Advanced Directives 11/05/2020 09/28/2020 11/05/2019  Does Patient Have a Medical Advance Directive? Yes Yes Yes  Type of Estate agent of [place];Living will Healthcare Power of [employer] of [place];Living will  Does patient want to make changes to medical advance directive? Yes (ED - Information included in AVS) No - Patient declined -  Copy of Healthcare Power of Attorney in Chart? No - copy requested - No - copy requested    Current Medications (verified) Outpatient Encounter Medications as of 11/05/2020  Medication Sig  . Ascorbic  Acid (VITAMIN C) 1000 MG tablet Take 1,000 mg by mouth daily.  . ferrous sulfate 325 (65 FE) MG EC tablet Take 325 mg by mouth daily with breakfast.  . Glucosamine-Chondroit-Vit C-Mn (GLUCOSAMINE CHONDR 1500 COMPLX) CAPS Take 1 capsule by mouth in the morning and at bedtime.   Marland Kitchen levothyroxine (SYNTHROID) 75 MCG tablet Take 1 tablet (75 mcg total) by mouth daily.  . Multiple Vitamins-Minerals (MULTIVITAMIN ADULTS 50+) TABS Take 1 tablet by mouth daily.  . Omega-3 Fatty Acids (FISH OIL TRIPLE STRENGTH) 1400 MG CAPS Take 1 capsule by mouth daily.  Marland Kitchen oxyCODONE-acetaminophen (PERCOCET/ROXICET) 5-325 MG tablet   . saw palmetto (V-R SAW PALMETTO) 160 MG capsule Take 450 mg by mouth 2 (two) times daily.   . simvastatin (ZOCOR) 20 MG tablet Take 1 tablet by mouth once daily (Patient taking differently: at bedtime. )  . [DISCONTINUED] ondansetron (ZOFRAN ODT) 4 MG disintegrating tablet Take 1 tablet (4 mg total) by mouth every 8 (eight) hours as needed for nausea or vomiting.   No facility-administered encounter medications on file as of 11/05/2020.   Allergies (verified) Patient has no known allergies.   History: Past Medical History:  Diagnosis Date  . Acute lateral meniscus tear of right knee   . Allergy   . Arthritis   . Colon polyps   . History of chickenpox as child  . Hyperlipidemia   . Hypothyroidism   . Sleep apnea    could not tolerate cpap mild osa   Past Surgical History:  Procedure Laterality Date  . KNEE ARTHROSCOPY WITH SUBCHONDROPLASTY Right 09/28/2020   Procedure: RIGHT KNEE ARTHROSCOPY WITH PARTIAL MEDIAL  MENISCECTOMY  AND MEDIAL FEMUR SUBCHONDROPLASTY;  Surgeon: Yolonda Kida, MD;  Location: [facility];  Service: Orthopedics;  Laterality: Right;   . left shoulder arthroscopy    . MENISCUS REPAIR Right 03/2018  . 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 right shoulder tear repaired  yrs ago  . TONSILLECTOMY AND  ADENOIDECTOMY  as child   Family History  Problem Relation Age of Onset  . COPD Mother    Social History   Socioeconomic History  . Marital status: Married    Spouse name: Not on file  . Number of children: Not on file  . Years of education: Not on file  . Highest education level: Not on file  Occupational History  . Not on file  Tobacco Use  . Smoking status: Former Smoker    Packs/day: 0.25    Years: 10.00    Pack years: 2.50    Types: Cigarettes    Quit date: 2010    Years since quitting: 11.8  . Smokeless tobacco: Former Neurosurgeon    Types: Engineer, drilling  . Vaping Use: Never used  Substance and Sexual Activity  . Alcohol use: Yes    Comment: 1 to 2 beers per day  . Drug use: Never  . Sexual activity: Not Currently  Other Topics Concern  . Not on file  Social History Narrative  . Not on file   Social Determinants of Health   Financial Resource Strain:   . Difficulty of Paying Living Expenses: Not on file  Food Insecurity:   . Worried About Programme researcher, broadcasting/film/video in the Last Year: Not on file  . Ran Out of Food in the Last Year: Not on file  Transportation Needs:   . Lack of Transportation (Medical): Not on file  . Lack of Transportation (Non-Medical): Not on file  Physical Activity:   . Days of Exercise per Week: Not on file  . Minutes of Exercise per Session: Not on file  Stress:   . Feeling of Stress : Not on file  Social Connections:   . Frequency of Communication with Friends and Family: Not on file  . Frequency of Social Gatherings with Friends and Family: Not on file  . Attends Religious Services: Not on file  . Active Member of Clubs or Organizations: Not on file  . Attends Banker Meetings: Not on file  . Marital Status: Not on file    Tobacco Counseling Counseling given: Yes   Clinical Intake:  Pre-visit preparation completed: No  Pain : 0-10 Pain Score: 3  Pain Type: Chronic pain Pain Location: Knee Pain Orientation:  Right Pain Descriptors / Indicators: Aching     BMI - recorded: 32.56 Nutritional Status: BMI > 30  Obese Diabetes: No  How often do you need to have someone help you when you read instructions, pamphlets, or other written materials from your doctor or pharmacy?: 1 - Never  Interpreter Needed?: No  Comments: Sees Ortho for his knee pain   Activities of Daily Living In your present state of health, do you have any difficulty performing the following activities: 11/05/2020 09/28/2020  Hearing? N N  Vision? N N  Comment wears glasses -  Difficulty concentrating or making decisions? N N  Walking or climbing stairs? N N  Dressing or bathing? N N  Doing errands, shopping? N -  Preparing Food and eating ? N -  Using the Toilet?  N -  In the past six months, have you accidently leaked urine? N -  Do you have problems with loss of bowel control? N -  Managing your Medications? N -  Managing your Finances? N -  Housekeeping or managing your Housekeeping? N -  Some recent data might be hidden    Patient Care Team: Noel Journey as PCP - General (Family Medicine)  Indicate any recent Medical Services you may have received from other than [facility] providers in the past year (date may be approximate).     Assessment:   This is a routine wellness examination for [employer].  Hearing/Vision screen No exam data present  Dietary issues and exercise activities discussed: Current Exercise Habits: Home exercise routine, Type of exercise: walking;Other - see comments (Golf), Time (Minutes): 30, Frequency (Times/Week): 3, Weekly Exercise (Minutes/Week): 90, Exercise limited by: orthopedic condition(s)  Goals   None    Depression Screen PHQ 2/9 Scores 11/05/2020 11/05/2019 11/05/2019 09/09/2019  PHQ - 2 Score 0 0 0 0  PHQ- 9 Score - - 0 0    Fall Risk Fall Risk  11/05/2020 11/05/2019 11/05/2019 09/09/2019  Falls in the past year? 0 0 0 0  Number falls in past yr: 0 0 0 0  Injury  with Fall? 0 - 0 0  Follow up Falls evaluation completed - Falls evaluation completed Falls evaluation completed    Any stairs in or around the home? Yes  If so, are there any without handrails? No  Home free of loose throw rugs in walkways, pet beds, electrical cords, etc? Yes  Adequate lighting in your home to reduce risk of falls? Yes   ASSISTIVE DEVICES UTILIZED TO PREVENT FALLS:  Life alert? No  Use of a cane, walker or w/c? No  Grab bars in the bathroom? Yes  Shower chair or bench in shower? No  Elevated toilet seat or a handicapped toilet? No   Gait steady and fast without use of assistive device  Cognitive Function: MMSE - Mini Mental State Exam 11/05/2019  Orientation to time 5  Orientation to Place 5  Registration 3  Attention/ Calculation 5  Recall 3  Language- name 2 objects 2  Language- repeat 1  Language- follow 3 step command 3  Language- read & follow direction 1  Write a sentence 1  Copy design 1  Total score 30        Immunizations Immunization History  Administered Date(s) Administered  . PFIZER SARS-COV-2 Vaccination 02/07/2020, 03/03/2020  . Pneumococcal Conjugate-13 11/05/2019  . Pneumococcal Polysaccharide-23 01/07/2013    TDAP status: Due, Education has been provided regarding the importance of this vaccine. Advised may receive this vaccine at local pharmacy or Health Dept. Aware to provide a copy of the vaccination record if obtained from local pharmacy or Health Dept. Verbalized acceptance and understanding. Flu Vaccine status: Declined, Education has been provided regarding the importance of this vaccine but patient still declined. Advised may receive this vaccine at local pharmacy or Health Dept. Aware to provide a copy of the vaccination record if obtained from local pharmacy or Health Dept. Verbalized acceptance and understanding. Pneumococcal vaccine status: Completed during today's visit. Covid-19 vaccine status: Completed  vaccines  Qualifies for Shingles Vaccine? Yes   Zostavax completed No   Shingrix Completed?: Yes - per patient. Records requested  Screening Tests Health Maintenance  Topic Date Due  . DTAP VACCINES (1) Never done  . DTaP/Tdap/Td (1 - Tdap) Never done  . COLONOSCOPY  Never done  . PNA vac Low Risk Adult (2 of 2 - PPSV23) 11/04/2020  . INFLUENZA VACCINE  03/17/2021 (Originally 07/18/2020)  . TETANUS/TDAP  11/05/2021 (Originally 01/20/1970)  . COVID-19 Vaccine  Completed  . Hepatitis C Screening  Completed    Health Maintenance  Health Maintenance Due  Topic Date Due  . DTAP VACCINES (1) Never done  . DTaP/Tdap/Td (1 - Tdap) Never done  . COLONOSCOPY  Never done  . PNA vac Low Risk Adult (2 of 2 - PPSV23) 11/04/2020   Colorectal cancer screening: Completed 2019. Repeat every 10 years  Lung Cancer Screening: (Low Dose CT Chest recommended if Age 80-80 years, 30 pack-year currently smoking OR have quit w/in 15years.) does not qualify.   Additional Screening:  Hepatitis C Screening: does qualify; Completed.  Vision Screening: Recommended annual ophthalmology exams for early detection of glaucoma and other disorders of the eye. Is the patient up to date with their annual eye exam?  Yes    Dental Screening: Recommended annual dental exams for proper oral hygiene    Plan:     Medicare Wellness During the course of the visit the patient was educated and counseled about appropriate screening and preventive services including: Fall prevention, Bone densitometry screening, Diabetes screening, Nutrition counseling.  Patient UTD on required immunizations. Will obtain fasting labs at today's visit.   Hypothyroidism  Taking medications as directed. Repeat TSH levels today. Will adjust medication according to lab results.   Hyperlipidemia Taking medications as directed. Repeat fasting Lipids and CMP today.   Prostate Cancer Screening He indicates understanding of the limitations of  this screening test and wishes  to proceed with screening PSA testing.   Patient Instructions (the written plan) was given to the patient.    I have personally reviewed and noted the following in the patient's chart:   . Medical and social history . Use of alcohol, tobacco or illicit drugs  . Current medications and supplements . Functional ability and status . Nutritional status . Physical activity . Advanced directives . List of other physicians . Hospitalizations, surgeries, and ER visits in previous 12 months . Vitals . Screenings to include cognitive, depression, and falls . Referrals and appointments  In addition, I have reviewed and discussed with patient certain preventive protocols, quality metrics, and best practice recommendations. A written personalized care plan for preventive services as well as general preventive health recommendations were provided to patient.     Piedad ClimesWilliam Cody Lilley Hubble, PA-C   11/05/2020

## 2020-11-08 ENCOUNTER — Other Ambulatory Visit: Payer: Self-pay

## 2020-11-08 DIAGNOSIS — E875 Hyperkalemia: Secondary | ICD-10-CM

## 2020-11-22 ENCOUNTER — Other Ambulatory Visit: Payer: Self-pay

## 2020-11-22 ENCOUNTER — Ambulatory Visit (INDEPENDENT_AMBULATORY_CARE_PROVIDER_SITE_OTHER): Payer: Medicare Other

## 2020-11-22 DIAGNOSIS — E875 Hyperkalemia: Secondary | ICD-10-CM

## 2020-11-22 LAB — BASIC METABOLIC PANEL
BUN: 8 mg/dL (ref 6–23)
CO2: 32 mEq/L (ref 19–32)
Calcium: 9.3 mg/dL (ref 8.4–10.5)
Chloride: 102 mEq/L (ref 96–112)
Creatinine, Ser: 1.04 mg/dL (ref 0.40–1.50)
GFR: 74.13 mL/min (ref >60.00)
Glucose, Bld: 97 mg/dL (ref 70–99)
Potassium: 4.4 mEq/L (ref 3.5–5.1)
Sodium: 139 mEq/L (ref 135–145)

## 2020-12-14 ENCOUNTER — Other Ambulatory Visit: Payer: Self-pay | Admitting: Physician Assistant

## 2020-12-14 DIAGNOSIS — E039 Hypothyroidism, unspecified: Secondary | ICD-10-CM

## 2021-03-16 ENCOUNTER — Other Ambulatory Visit: Payer: Self-pay | Admitting: Physician Assistant

## 2021-03-16 DIAGNOSIS — E7800 Pure hypercholesterolemia, unspecified: Secondary | ICD-10-CM

## 2021-03-18 HISTORY — PX: MENISCUS REPAIR: SHX5179

## 2021-03-21 NOTE — Telephone Encounter (Signed)
Patient is wanting to pick up rx this afternoon - Please advise

## 2021-03-21 NOTE — Telephone Encounter (Signed)
PA

## 2021-03-24 ENCOUNTER — Other Ambulatory Visit: Payer: Self-pay

## 2021-03-24 DIAGNOSIS — E039 Hypothyroidism, unspecified: Secondary | ICD-10-CM

## 2021-03-24 MED ORDER — LEVOTHYROXINE SODIUM 75 MCG PO TABS: 75.0000 ug | ORAL_TABLET | Freq: Every day | ORAL | 0 refills | Status: DC

## 2021-05-11 ENCOUNTER — Other Ambulatory Visit: Payer: Self-pay

## 2021-05-11 ENCOUNTER — Encounter: Payer: Self-pay | Admitting: Registered Nurse

## 2021-05-11 ENCOUNTER — Ambulatory Visit (INDEPENDENT_AMBULATORY_CARE_PROVIDER_SITE_OTHER): Payer: Medicare Other | Admitting: Registered Nurse

## 2021-05-11 VITALS — BP 126/80 | HR 73 | Temp 98.1°F | Ht 67.5 in | Wt 208.0 lb

## 2021-05-11 DIAGNOSIS — Z125 Encounter for screening for malignant neoplasm of prostate: Secondary | ICD-10-CM

## 2021-05-11 DIAGNOSIS — Z7689 Persons encountering health services in other specified circumstances: Secondary | ICD-10-CM

## 2021-05-11 DIAGNOSIS — E7800 Pure hypercholesterolemia, unspecified: Secondary | ICD-10-CM | POA: Diagnosis not present

## 2021-05-11 DIAGNOSIS — E039 Hypothyroidism, unspecified: Secondary | ICD-10-CM

## 2021-05-11 LAB — COMPREHENSIVE METABOLIC PANEL
ALT: 27 U/L (ref 0–53)
AST: 22 U/L (ref 0–37)
Albumin: 4.6 g/dL (ref 3.5–5.2)
Alkaline Phosphatase: 74 U/L (ref 39–117)
BUN: 13 mg/dL (ref 6–23)
CO2: 31 mEq/L (ref 19–32)
Calcium: 9.9 mg/dL (ref 8.4–10.5)
Chloride: 101 mEq/L (ref 96–112)
Creatinine, Ser: 1.05 mg/dL (ref 0.40–1.50)
GFR: 73.04 mL/min (ref >60.00)
Glucose, Bld: 85 mg/dL (ref 70–99)
Potassium: 4.1 mEq/L (ref 3.5–5.1)
Sodium: 140 mEq/L (ref 135–145)
Total Bilirubin: 0.7 mg/dL (ref 0.2–1.2)
Total Protein: 7.4 g/dL (ref 6.0–8.3)

## 2021-05-11 LAB — LIPID PANEL
Cholesterol: 153 mg/dL (ref 0–200)
HDL: 51.5 mg/dL (ref >39.00)
LDL Cholesterol: 85 mg/dL (ref 0–99)
NonHDL: 101.39
Total CHOL/HDL Ratio: 3
Triglycerides: 82 mg/dL (ref 0.0–149.0)
VLDL: 16.4 mg/dL (ref 0.0–40.0)

## 2021-05-11 LAB — CBC WITH DIFFERENTIAL/PLATELET
Basophils Absolute: 0.1 10*3/uL (ref 0.0–0.1)
Basophils Relative: 1.2 % (ref 0.0–3.0)
Eosinophils Absolute: 0.4 10*3/uL (ref 0.0–0.7)
Eosinophils Relative: 4.8 % (ref 0.0–5.0)
HCT: 44.6 % (ref 39.0–52.0)
Hemoglobin: 15.1 g/dL (ref 13.0–17.0)
Lymphocytes Relative: 17.4 % (ref 12.0–46.0)
Lymphs Abs: 1.3 10*3/uL (ref 0.7–4.0)
MCHC: 33.8 g/dL (ref 30.0–36.0)
MCV: 85 fl (ref 78.0–100.0)
Monocytes Absolute: 0.5 10*3/uL (ref 0.1–1.0)
Monocytes Relative: 6.4 % (ref 3.0–12.0)
Neutro Abs: 5.4 10*3/uL (ref 1.4–7.7)
Neutrophils Relative %: 70.2 % (ref 43.0–77.0)
Platelets: 232 10*3/uL (ref 150.0–400.0)
RBC: 5.24 Mil/uL (ref 4.22–5.81)
RDW: 13.9 % (ref 11.5–15.5)
WBC: 7.6 10*3/uL (ref 4.0–10.5)

## 2021-05-11 LAB — PSA, MEDICARE: PSA: 1.57 ng/ml (ref 0.10–4.00)

## 2021-05-11 LAB — TSH: TSH: 4.46 u[IU]/mL (ref 0.35–4.50)

## 2021-05-11 NOTE — Progress Notes (Signed)
Established Patient Office Visit  Subjective:  Patient ID: Eric Kerr, male    DOB: January 20, 1951  Age: 70 y.o. MRN: [id number]  CC:  Chief Complaint  Patient presents with  . Transfer of care    Pt is here for transfer of care from Eric Cogan, NP  . Hypothyroidism    HPI [name] presents for [facility]  Formerly pt of Eric Kerr, [name].   No acute concerns  Histories reviewed and updated with patient.   Hypothyroidism: Takes synthroid po qd. Good effect. No AEs. Tolerates well. Hopes to continue Last TSH on 11/05/20 was at 2.70 Has been following up q59mo  HLD: Simvastatin 20mg  PO qd Tolerates well Last lipids wnl Continue  Past Medical History:  Diagnosis Date  . Acute lateral meniscus tear of right knee   . Allergy   . Arthritis   . Colon polyps   . History of chickenpox as child  . Hyperlipidemia   . Hypothyroidism   . Sleep apnea    could not tolerate cpap mild osa    Past Surgical History:  Procedure Laterality Date  . KNEE ARTHROSCOPY WITH SUBCHONDROPLASTY Right 09/28/2020   Procedure: RIGHT KNEE ARTHROSCOPY WITH PARTIAL MEDIAL MENISCECTOMY  AND MEDIAL FEMUR SUBCHONDROPLASTY;  Surgeon: 11/28/2020, MD;  Location: [facility];  Service: Orthopedics;  Laterality: Right;  [facility]  . left shoulder arthroscopy    . MENISCUS REPAIR Right 03/2018  . 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 right shoulder tear repaired  yrs ago  . TONSILLECTOMY AND ADENOIDECTOMY  as child    Family History  Problem Relation Age of Onset  . COPD Mother     Social History   Socioeconomic History  . Marital status: Married    Spouse name: Not on file  . Number of children: Not on file  . Years of education: Not on file  . Highest education level: Not on file  Occupational History  . Not on file  Tobacco Use  . Smoking status: Former Smoker    Packs/day: 0.25    Years: 10.00    Pack years: 2.50    Types:  Cigarettes    Quit date: 2010    Years since quitting: 12.4  . Smokeless tobacco: Former 2011    Types: Neurosurgeon  . Vaping Use: Never used  Substance and Sexual Activity  . Alcohol use: Yes    Comment: 1 to 2 beers per day  . Drug use: Never  . Sexual activity: Not Currently  Other Topics Concern  . Not on file  Social History Narrative  . Not on file   Social Determinants of Health   Financial Resource Strain: Not on file  Food Insecurity: Not on file  Transportation Needs: Not on file  Physical Activity: Not on file  Stress: Not on file  Social Connections: Not on file  Intimate Partner Violence: Not on file    Outpatient Medications Prior to Visit  Medication Sig Dispense Refill  . Ascorbic Acid (VITAMIN C) 1000 MG tablet Take 1,000 mg by mouth daily.    . Glucosamine-Chondroit-Vit C-Mn (GLUCOSAMINE CHONDR 1500 COMPLX) CAPS Take 1 capsule by mouth in the morning and at bedtime.     Engineer, drilling levothyroxine (EUTHYROX) 75 MCG tablet Take 1 tablet (75 mcg total) by mouth daily. 90 tablet 0  . Multiple Vitamins-Minerals (MULTIVITAMIN ADULTS 50+) TABS Take 1 tablet by mouth daily.    Marland Kitchen  Omega-3 Fatty Acids (FISH OIL TRIPLE STRENGTH) 1400 MG CAPS Take 1 capsule by mouth daily.    . saw palmetto 160 MG capsule Take 450 mg by mouth 2 (two) times daily.     . simvastatin (ZOCOR) 20 MG tablet Take 1 tablet by mouth once daily 90 tablet 0  . ferrous sulfate 325 (65 FE) MG EC tablet Take 325 mg by mouth daily with breakfast.    . oxyCODONE-acetaminophen (PERCOCET/ROXICET) 5-325 MG tablet      No facility-administered medications prior to visit.    Allergies  Allergen Reactions  . Piroxicam Hives    Itching    ROS Review of Systems  Constitutional: Negative.   HENT: Negative.   Eyes: Negative.   Respiratory: Negative.   Cardiovascular: Negative.   Gastrointestinal: Negative.   Genitourinary: Negative.   Musculoskeletal: Negative.   Skin: Negative.   Neurological:  Negative.   Psychiatric/Behavioral: Negative.       Objective:    Physical Exam Constitutional:      General: He is not in acute distress.    Appearance: Normal appearance. He is normal weight. He is not ill-appearing, toxic-appearing or diaphoretic.  Cardiovascular:     Rate and Rhythm: Normal rate and regular rhythm.     Heart sounds: Normal heart sounds. No murmur heard. No friction rub. No gallop.   Pulmonary:     Effort: Pulmonary effort is normal. No respiratory distress.     Breath sounds: Normal breath sounds. No stridor. No wheezing, rhonchi or rales.  Chest:     Chest wall: No tenderness.  Neurological:     General: No focal deficit present.     Mental Status: He is alert and oriented to person, place, and time. Mental status is at baseline.  Psychiatric:        Mood and Affect: Mood normal.        Behavior: Behavior normal.        Thought Content: Thought content normal.        Judgment: Judgment normal.     BP 126/80 (BP Location: Left Arm, Patient Position: Sitting, Cuff Size: Large)   Pulse 73   Temp 98.1 F (36.7 C) (Temporal)   Ht 5' 7.5" (1.715 m)   Wt 208 lb (94.3 kg)   SpO2 97%   BMI 32.10 kg/m  Wt Readings from Last 3 Encounters:  05/11/21 208 lb (94.3 kg)  11/05/20 211 lb (95.7 kg)  09/28/20 211 lb 6.4 oz (95.9 kg)     Health Maintenance Due  Topic Date Due  . COVID-19 Vaccine (3 - Booster for Pfizer series) 08/03/2020    There are no preventive care reminders to display for this patient.  Lab Results  Component Value Date   TSH 2.70 11/05/2020   No results found for: WBC, HGB, HCT, MCV, PLT Lab Results  Component Value Date   NA 139 11/22/2020   K 4.4 11/22/2020   CO2 32 11/22/2020   GLUCOSE 97 11/22/2020   BUN 8 11/22/2020   CREATININE 1.04 11/22/2020   BILITOT 0.7 11/05/2020   ALKPHOS 80 11/05/2020   AST 22 11/05/2020   ALT 22 11/05/2020   PROT 7.2 11/05/2020   ALBUMIN 4.3 11/05/2020   CALCIUM 9.3 11/22/2020   GFR 74.13  11/22/2020   Lab Results  Component Value Date   CHOL 155 11/05/2020   Lab Results  Component Value Date   HDL 60.80 11/05/2020   Lab Results  Component Value Date  LDLCALC 78 11/05/2020   Lab Results  Component Value Date   TRIG 81.0 11/05/2020   Lab Results  Component Value Date   CHOLHDL 3 11/05/2020   No results found for: HGBA1C    Assessment & Plan:   Problem List Items Addressed This Visit      Endocrine   Hypothyroidism   Relevant Orders   TSH   Lipid panel   Comprehensive metabolic panel   CBC with Differential/Platelet     Other   Hyperlipidemia   Relevant Orders   TSH   Lipid panel   Comprehensive metabolic panel   CBC with Differential/Platelet    Other Visit Diagnoses    Encounter to establish care    -  Primary      No orders of the defined types were placed in this encounter.   Follow-up: No follow-ups on file.   PLAN  Labs collected. Will follow up with the patient as warranted.  Return in 6 mo if wnl  No acute concerns today  Patient encouraged to call clinic with any questions, comments, or concerns.   Janeece Agee, NP

## 2021-05-11 NOTE — Patient Instructions (Signed)
Mr. Badeaux -  Good to meet you  Labs today should be back tomorrow  If they're stable, I'll see you in 6 mo, if not, I'll let you know about coming back sooner.  Keep an eye on weight and restorative sleep. If these worsen, shoot me a message on MyChart and we can address what we need to do  Thank you  Luan Pulling

## 2021-06-12 ENCOUNTER — Other Ambulatory Visit: Payer: Self-pay | Admitting: Family

## 2021-06-12 DIAGNOSIS — E7800 Pure hypercholesterolemia, unspecified: Secondary | ICD-10-CM

## 2021-06-23 ENCOUNTER — Other Ambulatory Visit: Payer: Self-pay

## 2021-06-23 DIAGNOSIS — E7800 Pure hypercholesterolemia, unspecified: Secondary | ICD-10-CM

## 2021-06-23 MED ORDER — SIMVASTATIN 20 MG PO TABS: 20.0000 mg | ORAL_TABLET | Freq: Every day | ORAL | 1 refills | Status: DC

## 2021-06-23 NOTE — Telephone Encounter (Signed)
Pt has pharmacy send request last week and needs refill on simvastatin (ZOCOR) 20 MG tablet [id number] - [place], [place] - [address]  [address], [place] [address]   Pt call back [phone]

## 2021-08-29 ENCOUNTER — Encounter: Payer: Self-pay | Admitting: Emergency Medicine

## 2021-08-29 ENCOUNTER — Other Ambulatory Visit: Payer: Self-pay

## 2021-08-29 ENCOUNTER — Ambulatory Visit
Admission: EM | Admit: 2021-08-29 | Discharge: 2021-08-29 | Disposition: A | Discharge status: Home / Self Care | Payer: Medicare Other | Attending: Family | Admitting: Family

## 2021-08-29 ENCOUNTER — Ambulatory Visit: Payer: Self-pay

## 2021-08-29 DIAGNOSIS — R059 Cough, unspecified: Secondary | ICD-10-CM

## 2021-08-29 DIAGNOSIS — H1032 Unspecified acute conjunctivitis, left eye: Secondary | ICD-10-CM

## 2021-08-29 MED ORDER — GENTAMICIN SULFATE 0.3 % OP SOLN: 2.0000 [drp] | OPHTHALMIC | 0 refills | Status: AC

## 2021-08-29 NOTE — ED Provider Notes (Signed)
[facility]    CSN: [id number] Arrival date & time: 08/29/21  1147      History   Chief Complaint No chief complaint on file.   HPI Eric Kerr is a 70 y.o. male.   70 year old male presents with left yellow itching, redness and yellow drainage that started yesterday. Also slight swelling of eyelids. No distinct pain. Right eye without symptoms. Had a cold last week with some continued nasal congestion and cough today. Has not applied any eye drops. Young grandkids (all under 5) arrived last week and they had recent URI. No known exposure to pink eye. Does not wear contacts. Does have glasses. Other chronic health issues include thyroid disorder and hyperlipidemia. Currently on Zocor, Levothyroxine and supplements daily.   The history is provided by the patient.   Past Medical History:  Diagnosis Date   Acute lateral meniscus tear of right knee    Allergy    Arthritis    Colon polyps    History of chickenpox as child   Hyperlipidemia    Hypothyroidism    Sleep apnea    could not tolerate cpap mild osa    Patient Active Problem List   Diagnosis Date Noted   Hyperlipidemia 09/09/2019   Hypothyroidism 09/09/2019   Osteoarthritis of left knee 09/09/2019   History of colon polyps 09/09/2019    Past Surgical History:  Procedure Laterality Date   COLONOSCOPY     KNEE ARTHROSCOPY WITH SUBCHONDROPLASTY Right 09/28/2020   Procedure: RIGHT KNEE ARTHROSCOPY WITH PARTIAL MEDIAL MENISCECTOMY  AND MEDIAL FEMUR SUBCHONDROPLASTY;  Surgeon: Yolonda Kida, MD;  Location: [facility];  Service: Orthopedics;  Laterality: Right;    left shoulder arthroscopy     MENISCUS REPAIR Right 03/2018   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 Authorizing Provider  gentamicin (GARAMYCIN) 0.3 % ophthalmic solution Place 2 drops into the left eye every 4 (four) hours while awake for 5 days. 08/29/21 09/03/21 Yes Tana Trefry, Ali Lowe, NP  Ascorbic Acid (VITAMIN C) 1000 MG tablet Take 1,000 mg by mouth daily.    [provider]  Glucosamine-Chondroit-Vit C-Mn (GLUCOSAMINE CHONDR 1500 COMPLX) CAPS Take 1 capsule by mouth in the morning and at bedtime.     [provider]  levothyroxine (EUTHYROX) 75 MCG tablet Take 1 tablet (75 mcg total) by mouth daily. 03/24/21   Sheliah Hatch, MD  Multiple Vitamins-Minerals (MULTIVITAMIN ADULTS 50+) TABS Take 1 tablet by mouth daily.    [provider]  Omega-3 Fatty Acids (FISH OIL TRIPLE STRENGTH) 1400 MG CAPS Take 1 capsule by mouth daily.    [provider]  saw palmetto 160 MG capsule Take 450 mg by mouth 2 (two) times daily.     [provider]  simvastatin (ZOCOR) 20 MG tablet Take 1 tablet (20 mg total) by mouth daily. 06/23/21   Janeece Agee, NP    Family History Family History  Problem Relation Age of Onset   COPD Mother     Social History Social History   Tobacco Use   Smoking status: Former    Packs/day: 0.25    Years: 10.00    Pack years: 2.50    Types: Cigarettes  Quit date: 2010    Years since quitting: 12.7   Smokeless tobacco: Former    Types: Associate Professor Use: Never used  Substance Use Topics   Alcohol use: Yes    Comment: 1 to 2 beers per day   Drug use: Never     Allergies   Piroxicam   Review of Systems Review of Systems  Constitutional:  Negative for activity change, appetite change, chills, fatigue and fever.  HENT:  Positive for congestion, postnasal drip and sinus pressure. Negative for ear discharge, ear pain, facial swelling, mouth sores, sinus pain, sore throat and trouble swallowing.   Eyes:  Positive for discharge, redness and itching. Negative for photophobia and visual  disturbance. Eye pain: irritation. Respiratory:  Positive for cough. Negative for chest tightness, shortness of breath and wheezing.   Gastrointestinal:  Negative for nausea and vomiting.  Musculoskeletal:  Positive for arthralgias. Negative for neck pain and neck stiffness.  Skin:  Negative for color change and rash.  Allergic/Immunologic: Negative for environmental allergies, food allergies and immunocompromised state.  Neurological:  Negative for dizziness, seizures, syncope, weakness, light-headedness and headaches.  Hematological:  Negative for adenopathy. Does not bruise/bleed easily.    Physical Exam Triage Vital Signs ED Triage Vitals  Enc Vitals Group     BP 08/29/21 1308 (!) 164/78     Pulse Rate 08/29/21 1308 66     Resp 08/29/21 1308 17     Temp 08/29/21 1308 98.3 F (36.8 C)     Temp Source 08/29/21 1308 Oral     SpO2 08/29/21 1308 97 %     Weight --      Height --      Head Circumference --      Peak Flow --      Pain Score 08/29/21 1309 0     Pain Loc --      Pain Edu? --      Excl. in GC? --    No data found.  Updated Vital Signs BP (!) 164/78 (BP Location: Right Arm)   Pulse 66   Temp 98.3 F (36.8 C) (Oral)   Resp 17   SpO2 97%   Visual Acuity Right Eye Distance:   Left Eye Distance:   Bilateral Distance:    Right Eye Near:   Left Eye Near:    Bilateral Near:     Physical Exam Vitals and nursing note reviewed.  Constitutional:      General: He is awake. He is not in acute distress.    Appearance: He is well-developed and well-groomed.     Comments: He is sitting comfortably on the exam table in no acute distress.   HENT:     Head: Normocephalic and atraumatic.     Right Ear: Hearing, tympanic membrane, ear canal and external ear normal.     Left Ear: Hearing, tympanic membrane, ear canal and external ear normal.     Nose: Congestion present.     Right Sinus: No maxillary sinus tenderness or frontal sinus tenderness.     Left Sinus: No  maxillary sinus tenderness or frontal sinus tenderness.     Mouth/Throat:     Lips: Pink.     Mouth: Mucous membranes are moist.     Pharynx: Uvula midline. Posterior oropharyngeal erythema present. No pharyngeal swelling, oropharyngeal exudate or uvula swelling.  Eyes:     General: Vision grossly intact. No visual field deficit.  Right eye: No foreign body or hordeolum.        Left eye: No foreign body or hordeolum.     Extraocular Movements: Extraocular movements intact.     Conjunctiva/sclera:     Right eye: Right conjunctiva is not injected. No chemosis, exudate or hemorrhage.    Left eye: Left conjunctiva is injected. Chemosis and exudate present. No hemorrhage.    Pupils: Pupils are equal, round, and reactive to light.      Comments: Left upper and lower eyelids both slightly swollen and red. Yellowish discharge present at lacrimal border. Conjunctiva red but no distinct foreign bodies detected. No distinct tenderness over globe. Right eye not affected.   Cardiovascular:     Rate and Rhythm: Normal rate and regular rhythm.     Heart sounds: Normal heart sounds. No murmur heard. Pulmonary:     Effort: Pulmonary effort is normal. No respiratory distress.     Breath sounds: Normal breath sounds and air entry. No decreased air movement. No decreased breath sounds, wheezing, rhonchi or rales.  Musculoskeletal:     Cervical back: Normal range of motion and neck supple.  Lymphadenopathy:     Cervical: No cervical adenopathy.  Skin:    General: Skin is warm and dry.     Capillary Refill: Capillary refill takes less than 2 seconds.     Findings: No rash.  Neurological:     General: No focal deficit present.     Mental Status: He is alert and oriented to person, place, and time.  Psychiatric:        Mood and Affect: Mood normal.        Behavior: Behavior normal. Behavior is cooperative.        Thought Content: Thought content normal.        Judgment: Judgment normal.     UC  Treatments / Results  Labs (all labs ordered are listed, but only abnormal results are displayed) Labs Reviewed - No data to display  EKG   Radiology No results found.  Procedures Procedures (including critical care time)  Medications Ordered in UC Medications - No data to display  Initial Impression / Assessment and Plan / UC Course  I have reviewed the triage vital signs and the nursing notes.  Pertinent labs & imaging results that were available during my care of the patient were reviewed by me and considered in my medical decision making (see chart for details).    Reviewed with patient that he appears to have conjunctivitis (pink eye) of left eye. May be viral but will treat for possible bacterial infection. Recommend start Gentamicin 1 to 2 drops in left eye every 4 hours while awake. If symptoms develop in right eye, may use same medication as directed. Wash hands frequently. May continue OTC cold medication if needed. Follow-up in 2 days if not improving.   Final Clinical Impressions(s) / UC Diagnoses   Final diagnoses:  Acute bacterial conjunctivitis of left eye  Cough     Discharge Instructions      Recommend start Gentamicin eye drops- place 1 to 2 drops in the left eye every 4 hours while awake for 5 days. Wash hands frequently. You are less contagious after 24 hours. Recommend follow-up in 2 days if not improving.      ED Prescriptions     Medication Sig Dispense Auth. Provider   gentamicin (GARAMYCIN) 0.3 % ophthalmic solution Place 2 drops into the left eye every 4 (four) hours while awake  for 5 days. 5 mL Sudie Grumbling, NP      PDMP not reviewed this encounter.   Sudie Grumbling, NP 08/30/21 1105

## 2021-08-29 NOTE — ED Triage Notes (Signed)
Redness, drainage and itching to LT eye since yesterday.

## 2021-08-29 NOTE — Discharge Instructions (Addendum)
Recommend start Gentamicin eye drops- place 1 to 2 drops in the left eye every 4 hours while awake for 5 days. Wash hands frequently. You are less contagious after 24 hours. Recommend follow-up in 2 days if not improving.

## 2021-09-23 ENCOUNTER — Other Ambulatory Visit: Payer: Self-pay | Admitting: Family Medicine

## 2021-09-23 DIAGNOSIS — E039 Hypothyroidism, unspecified: Secondary | ICD-10-CM

## 2021-11-07 ENCOUNTER — Ambulatory Visit: Payer: Medicare Other

## 2021-11-16 ENCOUNTER — Ambulatory Visit (INDEPENDENT_AMBULATORY_CARE_PROVIDER_SITE_OTHER): Payer: Medicare Other | Admitting: Registered Nurse

## 2021-11-16 ENCOUNTER — Other Ambulatory Visit: Payer: Self-pay

## 2021-11-16 ENCOUNTER — Encounter: Payer: Self-pay | Admitting: Registered Nurse

## 2021-11-16 VITALS — BP 142/78 | HR 70 | Temp 98.3°F | Resp 18 | Ht 67.5 in | Wt 221.2 lb

## 2021-11-16 DIAGNOSIS — M79642 Pain in left hand: Secondary | ICD-10-CM | POA: Diagnosis not present

## 2021-11-16 DIAGNOSIS — E7800 Pure hypercholesterolemia, unspecified: Secondary | ICD-10-CM | POA: Diagnosis not present

## 2021-11-16 DIAGNOSIS — M79641 Pain in right hand: Secondary | ICD-10-CM

## 2021-11-16 DIAGNOSIS — E039 Hypothyroidism, unspecified: Secondary | ICD-10-CM

## 2021-11-16 LAB — TSH: TSH: 4.75 u[IU]/mL (ref 0.35–5.50)

## 2021-11-16 LAB — LIPID PANEL
Cholesterol: 197 mg/dL (ref 0–200)
HDL: 59.7 mg/dL (ref >39.00)
LDL Cholesterol: 104 mg/dL — ABNORMAL HIGH (ref 0–99)
NonHDL: 136.81
Total CHOL/HDL Ratio: 3
Triglycerides: 163 mg/dL — ABNORMAL HIGH (ref 0.0–149.0)
VLDL: 32.6 mg/dL (ref 0.0–40.0)

## 2021-11-16 LAB — CBC
HCT: 45.2 % (ref 39.0–52.0)
Hemoglobin: 15.2 g/dL (ref 13.0–17.0)
MCHC: 33.7 g/dL (ref 30.0–36.0)
MCV: 86.7 fl (ref 78.0–100.0)
Platelets: 222 10*3/uL (ref 150.0–400.0)
RBC: 5.21 Mil/uL (ref 4.22–5.81)
RDW: 14.2 % (ref 11.5–15.5)
WBC: 5.8 10*3/uL (ref 4.0–10.5)

## 2021-11-16 LAB — T4, FREE: Free T4: 0.79 ng/dL (ref 0.60–1.60)

## 2021-11-16 MED ORDER — DICLOFENAC SODIUM 1 % EX GEL: 2.0000 g | Freq: Four times a day (QID) | CUTANEOUS | 1 refills | Status: DC

## 2021-11-16 NOTE — Progress Notes (Signed)
Established Patient Office Visit  Subjective:  Patient ID: Eric Kerr, male    DOB: 06/16/1951  Age: 70 y.o. MRN: [id number]  CC:  Chief Complaint  Patient presents with   Follow-up    Patient states he is here for a 6 month follow up on cholesterol and also thyroid. Patient has no oter concerns.    HPI [name] presents for thyroid and lipid follow up  Hld Lab Results  Component Value Date   CHOL 153 05/11/2021   HDL 51.50 05/11/2021   LDLCALC 85 05/11/2021   TRIG 82.0 05/11/2021   CHOLHDL 3 05/11/2021  Taking simvastatin 20mg  po qd. Good effect. No AE. Hopes to continue.  Diet has been steady. Remains active.  Thyroid Lab Results  Component Value Date   TSH 4.46 05/11/2021  Taking synthroid 05/13/2021 po qd Good effect. No AE Some more fatigue lately. Some cramping on waking that resolves quickly. Occ uses OTC analgesics for this.   Past Medical History:  Diagnosis Date   Acute lateral meniscus tear of right knee    Allergy    Arthritis    Colon polyps    History of chickenpox as child   Hyperlipidemia    Hypothyroidism    Sleep apnea    could not tolerate cpap mild [facility]    Past Surgical History:  Procedure Laterality Date   COLONOSCOPY     KNEE ARTHROSCOPY WITH SUBCHONDROPLASTY Right 09/28/2020   Procedure: RIGHT KNEE ARTHROSCOPY WITH PARTIAL MEDIAL MENISCECTOMY  AND MEDIAL FEMUR SUBCHONDROPLASTY;  Surgeon: 11/28/2020, MD;  Location: [facility];  Service: Orthopedics;  Laterality: Right;  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   left tkr revision 2013   right shoulder tear repaired  yrs ago   TONSILLECTOMY AND ADENOIDECTOMY  as child    Family History  Problem Relation Age of Onset   COPD Mother     Social History   Socioeconomic History   Marital status: Married    Spouse name: Not on file   Number of children: 2   Years of education: Not on  file   Highest education level: Not on file  Occupational History   Not on file  Tobacco Use   Smoking status: Former    Packs/day: 0.25    Years: 10.00    Pack years: 2.50    Types: Cigarettes    Quit date: 2010    Years since quitting: 12.9   Smokeless tobacco: Former    Types: 2011 Use: Never used  Substance and Sexual Activity   Alcohol use: Yes    Comment: 1 to 2 beers per day   Drug use: Never   Sexual activity: Not Currently  Other Topics Concern   Not on file  Social History Narrative   Not on file   Social Determinants of Health   Financial Resource Strain: Not on file  Food Insecurity: Not on file  Transportation Needs: Not on file  Physical Activity: Not on file  Stress: Not on file  Social Connections: Not on file  Intimate Partner Violence: Not on file    Outpatient Medications Prior to Visit  Medication Sig Dispense Refill   Ascorbic Acid (VITAMIN C) 1000 MG tablet Take 1,000 mg by mouth daily.     Glucosamine-Chondroit-Vit C-Mn (GLUCOSAMINE CHONDR 1500 COMPLX) CAPS Take 1 capsule by mouth in  the morning and at bedtime.      levothyroxine (SYNTHROID) 75 MCG tablet Take 1 tablet by mouth once daily 90 tablet 0   Multiple Vitamins-Minerals (MULTIVITAMIN ADULTS 50+) TABS Take 1 tablet by mouth daily.     Omega-3 Fatty Acids (FISH OIL TRIPLE STRENGTH) 1400 MG CAPS Take 1 capsule by mouth daily.     simvastatin (ZOCOR) 20 MG tablet Take 1 tablet (20 mg total) by mouth daily. 90 tablet 1   saw palmetto 160 MG capsule Take 450 mg by mouth 2 (two) times daily.      No facility-administered medications prior to visit.    Allergies  Allergen Reactions   Piroxicam Hives    Itching    ROS Review of Systems  Constitutional: Negative.   HENT: Negative.    Eyes: Negative.   Respiratory: Negative.    Cardiovascular: Negative.   Gastrointestinal: Negative.   Genitourinary: Negative.   Musculoskeletal: Negative.   Skin: Negative.    Neurological: Negative.   Psychiatric/Behavioral: Negative.    All other systems reviewed and are negative.    Objective:    Physical Exam Constitutional:      General: He is not in acute distress.    Appearance: Normal appearance. He is normal weight. He is not ill-appearing, toxic-appearing or diaphoretic.  Cardiovascular:     Rate and Rhythm: Normal rate and regular rhythm.     Heart sounds: Normal heart sounds. No murmur heard.   No friction rub. No gallop.  Pulmonary:     Effort: Pulmonary effort is normal. No respiratory distress.     Breath sounds: Normal breath sounds. No stridor. No wheezing, rhonchi or rales.  Chest:     Chest wall: No tenderness.  Neurological:     General: No focal deficit present.     Mental Status: He is alert and oriented to person, place, and time. Mental status is at baseline.  Psychiatric:        Mood and Affect: Mood normal.        Behavior: Behavior normal.        Thought Content: Thought content normal.        Judgment: Judgment normal.    BP (!) 142/78   Pulse 70   Temp 98.3 F (36.8 C) (Temporal)   Resp 18   Ht 5' 7.5" (1.715 m)   Wt 221 lb 3.2 oz (100.3 kg)   SpO2 95%   BMI 34.13 kg/m  Wt Readings from Last 3 Encounters:  11/16/21 221 lb 3.2 oz (100.3 kg)  05/11/21 208 lb (94.3 kg)  11/05/20 211 lb (95.7 kg)     Health Maintenance Due  Topic Date Due   TETANUS/TDAP  Never done   Zoster Vaccines- Shingrix (1 of 2) Never done    There are no preventive care reminders to display for this patient.  Lab Results  Component Value Date   TSH 4.46 05/11/2021   Lab Results  Component Value Date   WBC 7.6 05/11/2021   HGB 15.1 05/11/2021   HCT 44.6 05/11/2021   MCV 85.0 05/11/2021   PLT 232.0 05/11/2021   Lab Results  Component Value Date   NA 140 05/11/2021   K 4.1 05/11/2021   CO2 31 05/11/2021   GLUCOSE 85 05/11/2021   BUN 13 05/11/2021   CREATININE 1.05 05/11/2021   BILITOT 0.7 05/11/2021   ALKPHOS 74  05/11/2021   AST 22 05/11/2021   ALT 27 05/11/2021   PROT 7.4 05/11/2021  ALBUMIN 4.6 05/11/2021   CALCIUM 9.9 05/11/2021   GFR 73.04 05/11/2021   Lab Results  Component Value Date   CHOL 153 05/11/2021   Lab Results  Component Value Date   HDL 51.50 05/11/2021   Lab Results  Component Value Date   LDLCALC 85 05/11/2021   Lab Results  Component Value Date   TRIG 82.0 05/11/2021   Lab Results  Component Value Date   CHOLHDL 3 05/11/2021   No results found for: HGBA1C    Assessment & Plan:   Problem List Items Addressed This Visit       Endocrine   Hypothyroidism   Relevant Orders   TSH   T4, free   CBC     Other   Hyperlipidemia   Relevant Orders   Lipid panel   CBC   Other Visit Diagnoses     Bilateral hand pain    -  Primary   Relevant Medications   diclofenac Sodium (VOLTAREN) 1 % GEL   Other Relevant Orders   CBC       Meds ordered this encounter  Medications   diclofenac Sodium (VOLTAREN) 1 % GEL    Sig: Apply 2 g topically 4 (four) times daily.    Dispense:  50 g    Refill:  1    Order Specific Question:   Supervising Provider    Answer:   Neva Seat, JEFFREY R [[id number]]     Follow-up: Return in about 6 months (around 05/16/2022) for Thyroid, HLD.   PLAN Labs collected. Will follow up with the patient as warranted. If steady, return q 6 mo Meds refilled x 6 mo Patient encouraged to call clinic with any questions, comments, or concerns.   Janeece Agee, NP

## 2021-11-16 NOTE — Patient Instructions (Addendum)
Eric Kerr -  [name] to see you!  Recommend topical diclofenac for pain pain. Use daily as needed. Should be covered by ins - sent to pharmacy  Checking TSH, T4, CBC, and lipids today on labs. If any signs for cause for fatigue, I'll let you know Taking daily multivitamin wouldn't hurt fatigue, or if you wanted something specific, I'd recommend D3 1000-2000 units daily.  See you in 6 mo, sooner if you need anything  Thanks,  Eric Kerr     If you have lab work done today you will be contacted with your lab results within the next 2 weeks.  If you have not heard from [place] then please contact us. The fastest way to get your results is to register for My Chart.   IF you received an x-ray today, you will receive an invoice from [facility]. Please contact [facility] at [phone] with questions or concerns regarding your invoice.   IF you received labwork today, you will receive an invoice from [place]. Please contact [facility] at [phone] with questions or concerns regarding your invoice.   Our billing staff will not be able to assist you with questions regarding bills from these companies.  You will be contacted with the lab results as soon as they are available. The fastest way to get your results is to activate your My Chart account. Instructions are located on the last page of this paperwork. If you have not heard from [place] regarding the results in 2 weeks, please contact this office.

## 2021-11-17 ENCOUNTER — Ambulatory Visit (INDEPENDENT_AMBULATORY_CARE_PROVIDER_SITE_OTHER): Payer: Medicare Other

## 2021-11-17 VITALS — Ht 67.5 in | Wt 221.0 lb

## 2021-11-17 DIAGNOSIS — Z0000 Encounter for general adult medical examination without abnormal findings: Secondary | ICD-10-CM

## 2021-11-17 NOTE — Patient Instructions (Signed)
Mr. Eric Kerr , Thank you for taking time to come for your Medicare Wellness Visit. I appreciate your ongoing commitment to your health goals. Please review the following plan we discussed and let me know if I can assist you in the future.   Screening recommendations/referrals: Colonoscopy: Done 11/17/2018 Repeat in 10 years  Recommended yearly ophthalmology/optometry visit for glaucoma screening and checkup Recommended yearly dental visit for hygiene and checkup  Vaccinations: Influenza vaccine: Declined. Pneumococcal vaccine: Done 11/05/2019 and 11/05/2020 Tdap vaccine: Repeat in 10 years  Shingles vaccine: Done 12/19/2020 and 02/16/2021   Covid-19: Done 02/07/2020 and 03/03/2020  Advanced directives: Please bring a copy of your health care power of attorney and living will to the office to be added to your chart at your convenience.   Conditions/risks identified: Aim for 30 minutes of exercise or brisk walking each day, drink 6-8 glasses of water and eat lots of fruits and vegetables.   Next appointment: Follow up in one year for your annual wellness visit. 2023.  Preventive Care 68 Years and Older, Male  Preventive care refers to lifestyle choices and visits with your health care provider that can promote health and wellness. What does preventive care include? A yearly physical exam. This is also called an annual well check. Dental exams once or twice a year. Routine eye exams. Ask your health care provider how often you should have your eyes checked. Personal lifestyle choices, including: Daily care of your teeth and gums. Regular physical activity. Eating a healthy diet. Avoiding tobacco and drug use. Limiting alcohol use. Practicing safe sex. Taking low doses of aspirin every day. Taking vitamin and mineral supplements as recommended by your health care provider. What happens during an annual well check? The services and screenings done by your health care provider during  your annual well check will depend on your age, overall health, lifestyle risk factors, and family history of disease. Counseling  Your health care provider may ask you questions about your: Alcohol use. Tobacco use. Drug use. Emotional well-being. Home and relationship well-being. Sexual activity. Eating habits. History of falls. Memory and ability to understand (cognition). Work and work Astronomer. Screening  You may have the following tests or measurements: Height, weight, and BMI. Blood pressure. Lipid and cholesterol levels. These may be checked every 5 years, or more frequently if you are over 42 years old. Skin check. Lung cancer screening. You may have this screening every year starting at age 8 if you have a 30-pack-year history of smoking and currently smoke or have quit within the past 15 years. Fecal occult blood test (FOBT) of the stool. You may have this test every year starting at age 48. Flexible sigmoidoscopy or colonoscopy. You may have a sigmoidoscopy every 5 years or a colonoscopy every 10 years starting at age 49. Prostate cancer screening. Recommendations will vary depending on your family history and other risks. Hepatitis C blood test. Hepatitis B blood test. Sexually transmitted disease (STD) testing. Diabetes screening. This is done by checking your blood sugar (glucose) after you have not eaten for a while (fasting). You may have this done every 1-3 years. Abdominal aortic aneurysm (AAA) screening. You may need this if you are a current or former smoker. Osteoporosis. You may be screened starting at age 38 if you are at high risk. Talk with your health care provider about your test results, treatment options, and if necessary, the need for more tests. Vaccines  Your health care provider may recommend certain vaccines, such  as: Influenza vaccine. This is recommended every year. Tetanus, diphtheria, and acellular pertussis (Tdap, Td) vaccine. You may need  a Td booster every 10 years. Zoster vaccine. You may need this after age 28. Pneumococcal 13-valent conjugate (PCV13) vaccine. One dose is recommended after age 53. Pneumococcal polysaccharide (PPSV23) vaccine. One dose is recommended after age 55. Talk to your health care provider about which screenings and vaccines you need and how often you need them. This information is not intended to replace advice given to you by your health care provider. Make sure you discuss any questions you have with your health care provider. Document Released: 12/31/2015 Document Revised: 08/23/2016 Document Reviewed: 10/05/2015 Elsevier Interactive Patient Education  2017 [place] Prevention in the Home Falls can cause injuries. They can happen to people of all ages. There are many things you can do to make your home safe and to help prevent falls. What can I do on the outside of my home? Regularly fix the edges of walkways and driveways and fix any cracks. Remove anything that might make you trip as you walk through a door, such as a raised step or threshold. Trim any bushes or trees on the path to your home. Use bright outdoor lighting. Clear any walking paths of anything that might make someone trip, such as rocks or tools. Regularly check to see if handrails are loose or broken. Make sure that both sides of any steps have handrails. Any raised decks and porches should have guardrails on the edges. Have any leaves, snow, or ice cleared regularly. Use sand or salt on walking paths during winter. Clean up any spills in your garage right away. This includes oil or grease spills. What can I do in the bathroom? Use night lights. Install grab bars by the toilet and in the tub and shower. Do not use towel bars as grab bars. Use non-skid mats or decals in the tub or shower. If you need to sit down in the shower, use a plastic, non-slip stool. Keep the floor dry. Clean up any water that spills on the  floor as soon as it happens. Remove soap buildup in the tub or shower regularly. Attach bath mats securely with double-sided non-slip rug tape. Do not have throw rugs and other things on the floor that can make you trip. What can I do in the bedroom? Use night lights. Make sure that you have a light by your bed that is easy to reach. Do not use any sheets or blankets that are too big for your bed. They should not hang down onto the floor. Have a firm chair that has side arms. You can use this for support while you get dressed. Do not have throw rugs and other things on the floor that can make you trip. What can I do in the kitchen? Clean up any spills right away. Avoid walking on wet floors. Keep items that you use a lot in easy-to-reach places. If you need to reach something above you, use a strong step stool that has a grab bar. Keep electrical cords out of the way. Do not use floor polish or wax that makes floors slippery. If you must use wax, use non-skid floor wax. Do not have throw rugs and other things on the floor that can make you trip. What can I do with my stairs? Do not leave any items on the stairs. Make sure that there are handrails on both sides of the stairs and use  them. Fix handrails that are broken or loose. Make sure that handrails are as long as the stairways. Check any carpeting to make sure that it is firmly attached to the stairs. Fix any carpet that is loose or worn. Avoid having throw rugs at the top or bottom of the stairs. If you do have throw rugs, attach them to the floor with carpet tape. Make sure that you have a light switch at the top of the stairs and the bottom of the stairs. If you do not have them, ask someone to add them for you. What else can I do to help prevent falls? Wear shoes that: Do not have high heels. Have rubber bottoms. Are comfortable and fit you well. Are closed at the toe. Do not wear sandals. If you use a stepladder: Make sure that  it is fully opened. Do not climb a closed stepladder. Make sure that both sides of the stepladder are locked into place. Ask someone to hold it for you, if possible. Clearly [place] and make sure that you can see: Any grab bars or handrails. First and last steps. Where the edge of each step is. Use tools that help you move around (mobility aids) if they are needed. These include: Canes. Walkers. Scooters. Crutches. Turn on the lights when you go into a dark area. Replace any light bulbs as soon as they burn out. Set up your furniture so you have a clear path. Avoid moving your furniture around. If any of your floors are uneven, fix them. If there are any pets around you, be aware of where they are. Review your medicines with your doctor. Some medicines can make you feel dizzy. This can increase your chance of falling. Ask your doctor what other things that you can do to help prevent falls. This information is not intended to replace advice given to you by your health care provider. Make sure you discuss any questions you have with your health care provider. Document Released: 09/30/2009 Document Revised: 05/11/2016 Document Reviewed: 01/08/2015 Elsevier Interactive Patient Education  2017 [employer].

## 2021-11-17 NOTE — Progress Notes (Signed)
Subjective:   Eric Kerr is a 70 y.o. male who presents for Medicare Annual/Subsequent preventive examination. Virtual Visit via Telephone Note  I connected with  Eric Kerr on 11/17/21 at  3:45 PM EST by telephone and verified that I am speaking with the correct person using two identifiers.  Location: Patient: HOME Provider: [facility] Persons participating in the virtual visit: patient/Nurse Health Advisor   I discussed the limitations, risks, security and privacy concerns of performing an evaluation and management service by telephone and the availability of in person appointments. The patient expressed understanding and agreed to proceed.  Interactive audio and video telecommunications were attempted between this nurse and patient, however failed, due to patient having technical difficulties OR patient did not have access to video capability.  We continued and completed visit with audio only.  Some vital signs may be absent or patient reported.   Eric Dash, LPN  Review of Systems     Cardiac Risk Factors include: advanced age (>57men, >74 women);hypertension;dyslipidemia;male gender;obesity (BMI >30kg/m2);sedentary lifestyle     Objective:    Today's Vitals   11/17/21 1551  Weight: 221 lb (100.2 kg)  Height: 5' 7.5" (1.715 m)   Body mass index is 34.1 kg/m.  Advanced Directives 11/17/2021 11/05/2020 09/28/2020 11/05/2019  Does Patient Have a Medical Advance Directive? Yes Yes Yes Yes  Type of Estate agent of [place];Living will Healthcare Power of [place];Living will Healthcare Power of [employer] of [place];Living will  Does patient want to make changes to medical advance directive? - Yes (ED - Information included in AVS) No - Patient declined -  Copy of Healthcare Power of Attorney in Chart? No - copy requested No - copy requested - No - copy requested  Would patient like information on creating a medical  advance directive? No - Patient declined - - -    Current Medications (verified) Outpatient Encounter Medications as of 11/17/2021  Medication Sig   Ascorbic Acid (VITAMIN C) 1000 MG tablet Take 1,000 mg by mouth daily.   diclofenac Sodium (VOLTAREN) 1 % GEL Apply 2 g topically 4 (four) times daily.   Glucosamine-Chondroit-Vit C-Mn (GLUCOSAMINE CHONDR 1500 COMPLX) CAPS Take 1 capsule by mouth in the morning and at bedtime.    levothyroxine (SYNTHROID) 75 MCG tablet Take 1 tablet by mouth once daily   Multiple Vitamins-Minerals (MULTIVITAMIN ADULTS 50+) TABS Take 1 tablet by mouth daily.   Omega-3 Fatty Acids (FISH OIL TRIPLE STRENGTH) 1400 MG CAPS Take 1 capsule by mouth daily.   simvastatin (ZOCOR) 20 MG tablet Take 1 tablet (20 mg total) by mouth daily.   No facility-administered encounter medications on file as of 11/17/2021.    Allergies (verified) Piroxicam   History: Past Medical History:  Diagnosis Date   Acute lateral meniscus tear of right knee    Allergy    Arthritis    Colon polyps    History of chickenpox as child   Hyperlipidemia    Hypothyroidism    Sleep apnea    could not tolerate cpap mild osa   Past Surgical History:  Procedure Laterality Date   COLONOSCOPY     KNEE ARTHROSCOPY WITH SUBCHONDROPLASTY Right 09/28/2020   Procedure: RIGHT KNEE ARTHROSCOPY WITH PARTIAL MEDIAL MENISCECTOMY  AND MEDIAL FEMUR SUBCHONDROPLASTY;  Surgeon: Yolonda Kida, MD;  Location: [facility];  Service: Orthopedics;  Laterality: Right;    left shoulder arthroscopy     MENISCUS REPAIR Right 03/2018   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   left tkr revision 2013   right shoulder tear repaired  yrs ago   TONSILLECTOMY AND ADENOIDECTOMY  as child   Family History  Problem Relation Age of Onset   COPD Mother    Social History   Socioeconomic History   Marital status: Married    Spouse name: Not on file   Number of children: 2   Years of  education: Not on file   Highest education level: Not on file  Occupational History   Not on file  Tobacco Use   Smoking status: Former    Packs/day: 0.25    Years: 10.00    Pack years: 2.50    Types: Cigarettes    Quit date: 2010    Years since quitting: 12.9   Smokeless tobacco: Former    Types: Associate Professor Use: Never used  Substance and Sexual Activity   Alcohol use: Yes    Comment: 1 to 2 beers per day   Drug use: Never   Sexual activity: Not Currently  Other Topics Concern   Not on file  Social History Narrative   Not on file   Social Determinants of Health   Financial Resource Strain: Low Risk    Difficulty of Paying Living Expenses: Not hard at all  Food Insecurity: No Food Insecurity   Worried About Programme researcher, broadcasting/film/video in the Last Year: Never true   Ran Out of Food in the Last Year: Never true  Transportation Needs: No Transportation Needs   Lack of Transportation (Medical): No   Lack of Transportation (Non-Medical): No  Physical Activity: Sufficiently Active   Days of Exercise per Week: 3 days   Minutes of Exercise per Session: 50 min  Stress: No Stress Concern Present   Feeling of Stress : Not at all  Social Connections: Socially Integrated   Frequency of Communication with Friends and Family: More than three times a week   Frequency of Social Gatherings with Friends and Family: More than three times a week   Attends Religious Services: More than 4 times per year   Active Member of [employer] or Organizations: Yes   Attends Engineer, structural: More than 4 times per year   Marital Status: Married    Tobacco Counseling Counseling given: Not Answered   Clinical Intake:  Pre-visit preparation completed: Yes  Pain : No/denies pain     BMI - recorded: 34.1 Nutritional Status: BMI > 30  Obese Nutritional Risks: None Diabetes: No  How often do you need to have someone help you when you read instructions, pamphlets, or other  written materials from your doctor or pharmacy?: 1 - Never  Diabetic?no  Interpreter Needed?: No  Information entered by :: MJ Orlandus Borowski, LPN   Activities of Daily Living In your present state of health, do you have any difficulty performing the following activities: 11/17/2021 11/16/2021  Hearing? N N  Vision? N N  Difficulty concentrating or making decisions? N N  Walking or climbing stairs? N N  Dressing or bathing? N N  Doing errands, shopping? N N  Preparing Food and eating ? N -  Using the Toilet? N -  In the past six months, have you accidently leaked urine? N -  Do you have problems with loss of bowel control? N -  Managing your Medications? N -  Managing your Finances? N -  Housekeeping or managing your Housekeeping? N -  Some recent data  might be hidden    Patient Care Team: Janeece Agee, NP as PCP - General (Adult Health Nurse Practitioner)  Indicate any recent Medical Services you may have received from other than [facility] providers in the past year (date may be approximate).     Assessment:   This is a routine wellness examination for [employer].  Hearing/Vision screen Hearing Screening - Comments:: No hearing issues.  Vision Screening - Comments:: Glasses. [facility] in [place]. 2020  Dietary issues and exercise activities discussed: Current Exercise Habits: Home exercise routine, Type of exercise: walking, Time (Minutes): 50, Frequency (Times/Week): 3, Weekly Exercise (Minutes/Week): 150, Intensity: Mild, Exercise limited by: cardiac condition(s)   Goals Addressed             This Visit's Progress    DIET - REDUCE CALORIE INTAKE       Pt states he would like to lose some weight and continue to exercise and stay healthy.       Depression Screen PHQ 2/9 Scores 11/17/2021 11/16/2021 11/05/2020 11/05/2019 11/05/2019 09/09/2019  PHQ - 2 Score 0 0 0 0 0 0  PHQ- 9 Score 0 0 - - 0 0    Fall Risk Fall Risk  11/17/2021 11/16/2021 11/05/2020 11/05/2019 11/05/2019   Falls in the past year? - 0 0 0 0  Number falls in past yr: 0 0 0 0 0  Injury with Fall? 0 0 0 - 0  Risk for fall due to : No Fall Risks No Fall Risks - - -  Follow up Falls prevention discussed Falls evaluation completed Falls evaluation completed - Falls evaluation completed    FALL RISK PREVENTION PERTAINING TO THE HOME:  Any stairs in or around the home? Yes  If so, are there any without handrails? No  Home free of loose throw rugs in walkways, pet beds, electrical cords, etc? Yes  Adequate lighting in your home to reduce risk of falls? Yes   ASSISTIVE DEVICES UTILIZED TO PREVENT FALLS:  Life alert? No  Use of a cane, walker or w/c? No  Grab bars in the bathroom? Yes  Shower chair or bench in shower? No  Elevated toilet seat or a handicapped toilet? No   TIMED UP AND GO:  Was the test performed? No . Phone visit.  Cognitive Function: MMSE - Mini Mental State Exam 11/05/2019  Orientation to time 5  Orientation to Place 5  Registration 3  Attention/ Calculation 5  Recall 3  Language- name 2 objects 2  Language- repeat 1  Language- follow 3 step command 3  Language- read & follow direction 1  Write a sentence 1  Copy design 1  Total score 30     6CIT Screen 11/17/2021 11/17/2021  What Year? 0 points 0 points  What month? 0 points 0 points  What time? 0 points 0 points  Count back from 20 0 points 0 points  Months in reverse 0 points 0 points  Repeat phrase 0 points 0 points  Total Score 0 0    Immunizations Immunization History  Administered Date(s) Administered   PFIZER(Purple Top)SARS-COV-2 Vaccination 02/07/2020, 03/03/2020   Pneumococcal Conjugate-13 11/05/2019   Pneumococcal Polysaccharide-23 01/07/2013, 11/05/2020    TDAP status: Due, Education has been provided regarding the importance of this vaccine. Advised may receive this vaccine at local pharmacy or Health Dept. Aware to provide a copy of the vaccination record if obtained from local pharmacy  or Health Dept. Verbalized acceptance and understanding.  Flu Vaccine status: Declined, Education has  been provided regarding the importance of this vaccine but patient still declined. Advised may receive this vaccine at local pharmacy or Health Dept. Aware to provide a copy of the vaccination record if obtained from local pharmacy or Health Dept. Verbalized acceptance and understanding.  Pneumococcal vaccine status: Up to date  Covid-19 vaccine status: Completed vaccines  Qualifies for Shingles Vaccine? Yes   Zostavax completed No   Shingrix Completed?: Yes  Screening Tests Health Maintenance  Topic Date Due   TETANUS/TDAP  Never done   Zoster Vaccines- Shingrix (1 of 2) Never done   COVID-19 Vaccine (3 - Booster for Pfizer series) 12/02/2021 (Originally 04/28/2020)   INFLUENZA VACCINE  03/17/2022 (Originally 07/18/2021)   COLONOSCOPY (Pts 45-50yrs Insurance coverage will need to be confirmed)  11/17/2028   Pneumonia Vaccine 64+ Years old  Completed   Hepatitis C Screening  Completed   HPV VACCINES  Aged Out    Health Maintenance  Health Maintenance Due  Topic Date Due   TETANUS/TDAP  Never done   Zoster Vaccines- Shingrix (1 of 2) Never done    Colorectal cancer screening: Type of screening: Colonoscopy. Completed 11/17/2018. Repeat every 10 years  Lung Cancer Screening: (Low Dose CT Chest recommended if Age 26-80 years, 30 pack-year currently smoking OR have quit w/in 15years.) does not qualify.  Quit over 15 years ago.  Additional Screening:  Hepatitis C Screening: does qualify; Completed 11/05/2019  Vision Screening: Recommended annual ophthalmology exams for early detection of glaucoma and other disorders of the eye. Is the patient up to date with their annual eye exam?  No  Who is the provider or what is the name of the office in which the patient attends annual eye exams? [facility] in [place] If pt is not established with a provider, would they like to be referred to a  provider to establish care? No .   Dental Screening: Recommended annual dental exams for proper oral hygiene  Community Resource Referral / Chronic Care Management: CRR required this visit?  No   CCM required this visit?  No      Plan:     I have personally reviewed and noted the following in the patient's chart:   Medical and social history Use of alcohol, tobacco or illicit drugs  Current medications and supplements including opioid prescriptions. Patient is not currently taking opioid prescriptions. Functional ability and status Nutritional status Physical activity Advanced directives List of other physicians Hospitalizations, surgeries, and ER visits in previous 12 months Vitals Screenings to include cognitive, depression, and falls Referrals and appointments  In addition, I have reviewed and discussed with patient certain preventive protocols, quality metrics, and best practice recommendations. A written personalized care plan for preventive services as well as general preventive health recommendations were provided to patient.     Eric Dash, LPN   [id number]   Nurse Notes: Phone visit. Pt at HOME. Nurse at [facility]. Pt doing well. Declined flu vaccine. Discussed shingrix vaccine and how to obtain. Discussed need for updated eye exam. Pt states he will call to schedule.

## 2021-12-19 ENCOUNTER — Other Ambulatory Visit: Payer: Self-pay | Admitting: Registered Nurse

## 2021-12-19 DIAGNOSIS — E039 Hypothyroidism, unspecified: Secondary | ICD-10-CM

## 2021-12-26 ENCOUNTER — Telehealth: Payer: Self-pay | Admitting: Registered Nurse

## 2021-12-26 ENCOUNTER — Other Ambulatory Visit: Payer: Self-pay

## 2021-12-26 DIAGNOSIS — E039 Hypothyroidism, unspecified: Secondary | ICD-10-CM

## 2021-12-26 DIAGNOSIS — E7800 Pure hypercholesterolemia, unspecified: Secondary | ICD-10-CM

## 2021-12-26 MED ORDER — LEVOTHYROXINE SODIUM 75 MCG PO TABS: 75.0000 ug | ORAL_TABLET | Freq: Every day | ORAL | 0 refills | Status: DC

## 2021-12-26 MED ORDER — SIMVASTATIN 20 MG PO TABS: 20.0000 mg | ORAL_TABLET | Freq: Every day | ORAL | 1 refills | Status: DC

## 2021-12-26 NOTE — Telephone Encounter (Signed)
Pt would like [facility]othyroxine [facility] [address]. He states that he no longer wishes to use [facility] in [place].  He will not be picking up the script from 12/20/21   Please advise

## 2021-12-26 NOTE — Telephone Encounter (Signed)
Patient medication has been sent to the pharmacy ?

## 2022-03-19 ENCOUNTER — Other Ambulatory Visit: Payer: Self-pay | Admitting: Registered Nurse

## 2022-03-19 DIAGNOSIS — E039 Hypothyroidism, unspecified: Secondary | ICD-10-CM

## 2022-05-17 ENCOUNTER — Encounter: Payer: Self-pay | Admitting: Registered Nurse

## 2022-05-17 ENCOUNTER — Ambulatory Visit (INDEPENDENT_AMBULATORY_CARE_PROVIDER_SITE_OTHER): Payer: Medicare Other | Admitting: Registered Nurse

## 2022-05-17 VITALS — BP 132/76 | HR 76 | Temp 97.6°F | Resp 18 | Ht 67.5 in | Wt 210.4 lb

## 2022-05-17 DIAGNOSIS — M130 Polyarthritis, unspecified: Secondary | ICD-10-CM

## 2022-05-17 DIAGNOSIS — E039 Hypothyroidism, unspecified: Secondary | ICD-10-CM

## 2022-05-17 DIAGNOSIS — E7800 Pure hypercholesterolemia, unspecified: Secondary | ICD-10-CM

## 2022-05-17 LAB — LIPID PANEL
Cholesterol: 175 mg/dL (ref 0–200)
HDL: 64.8 mg/dL (ref >39.00)
LDL Cholesterol: 93 mg/dL (ref 0–99)
NonHDL: 109.84
Total CHOL/HDL Ratio: 3
Triglycerides: 84 mg/dL (ref 0.0–149.0)
VLDL: 16.8 mg/dL (ref 0.0–40.0)

## 2022-05-17 LAB — CBC WITH DIFFERENTIAL/PLATELET
Basophils Absolute: 0.1 10*3/uL (ref 0.0–0.1)
Basophils Relative: 1 % (ref 0.0–3.0)
Eosinophils Absolute: 0.2 10*3/uL (ref 0.0–0.7)
Eosinophils Relative: 4.3 % (ref 0.0–5.0)
HCT: 45.1 % (ref 39.0–52.0)
Hemoglobin: 15.1 g/dL (ref 13.0–17.0)
Lymphocytes Relative: 22.6 % (ref 12.0–46.0)
Lymphs Abs: 1.3 10*3/uL (ref 0.7–4.0)
MCHC: 33.4 g/dL (ref 30.0–36.0)
MCV: 87.1 fl (ref 78.0–100.0)
Monocytes Absolute: 0.5 10*3/uL (ref 0.1–1.0)
Monocytes Relative: 8.2 % (ref 3.0–12.0)
Neutro Abs: 3.7 10*3/uL (ref 1.4–7.7)
Neutrophils Relative %: 63.9 % (ref 43.0–77.0)
Platelets: 222 10*3/uL (ref 150.0–400.0)
RBC: 5.18 Mil/uL (ref 4.22–5.81)
RDW: 14.7 % (ref 11.5–15.5)
WBC: 5.8 10*3/uL (ref 4.0–10.5)

## 2022-05-17 LAB — COMPREHENSIVE METABOLIC PANEL
ALT: 24 U/L (ref 0–53)
AST: 23 U/L (ref 0–37)
Albumin: 4.5 g/dL (ref 3.5–5.2)
Alkaline Phosphatase: 70 U/L (ref 39–117)
BUN: 13 mg/dL (ref 6–23)
CO2: 29 mEq/L (ref 19–32)
Calcium: 9.4 mg/dL (ref 8.4–10.5)
Chloride: 100 mEq/L (ref 96–112)
Creatinine, Ser: 1 mg/dL (ref 0.40–1.50)
GFR: 76.89 mL/min (ref >60.00)
Glucose, Bld: 69 mg/dL — ABNORMAL LOW (ref 70–99)
Potassium: 4.1 mEq/L (ref 3.5–5.1)
Sodium: 138 mEq/L (ref 135–145)
Total Bilirubin: 0.7 mg/dL (ref 0.2–1.2)
Total Protein: 7.3 g/dL (ref 6.0–8.3)

## 2022-05-17 LAB — TSH: TSH: 4.91 u[IU]/mL (ref 0.35–5.50)

## 2022-05-17 MED ORDER — SIMVASTATIN 20 MG PO TABS: 20.0000 mg | ORAL_TABLET | Freq: Every day | ORAL | 1 refills | Status: DC

## 2022-05-17 MED ORDER — HYDROXYZINE HCL 10 MG PO TABS: 10.0000 mg | ORAL_TABLET | Freq: Three times a day (TID) | ORAL | 0 refills | Status: DC | PRN

## 2022-05-17 MED ORDER — LEVOTHYROXINE SODIUM 75 MCG PO TABS: 75.0000 ug | ORAL_TABLET | Freq: Every day | ORAL | 1 refills | Status: DC

## 2022-05-17 MED ORDER — DICLOFENAC SODIUM 75 MG PO TBEC: 75.0000 mg | DELAYED_RELEASE_TABLET | Freq: Two times a day (BID) | ORAL | 0 refills | Status: DC

## 2022-05-17 NOTE — Assessment & Plan Note (Signed)
Labs collected. Will follow up with the patient as warranted. Continue statin 

## 2022-05-17 NOTE — Assessment & Plan Note (Signed)
Labs collected. Will follow up with the patient as warranted.  

## 2022-05-17 NOTE — Assessment & Plan Note (Signed)
Will try diclofenac PO as above. Giving hydroxyzine in case of reaction. Advised to seek emergency care if reaction persists. Reviewed risks, benefits, and side effects, pt voices understanding.

## 2022-05-17 NOTE — Progress Notes (Signed)
Established Patient Office Visit  Subjective:  Patient ID: Eric Kerr, male    DOB: 1951/07/21  Age: 71 y.o. MRN: [id number]  CC:  Chief Complaint  Patient presents with   Follow-up    Patient states he is here for 6 month follow up    HPI Eric Kerr presents for 6 mo follow up  Thyroid Doing well, some fatigue Wears out by midday/early afternoon.   HLD On simvastatin 20mg  po qd Good effect, no AE Lab Results  Component Value Date   CHOL 197 11/16/2021   HDL 59.70 11/16/2021   LDLCALC 104 (H) 11/16/2021   TRIG 163.0 (H) 11/16/2021   CHOLHDL 3 11/16/2021   Fatigue Sleep is hit or miss. Wakes up tired at times.  Dog wakes him up at times in the night.  Has been pursuing healthy diet and exercise to lose weight to help his OSA, could not tolerate CPAP.   Arthritis Worse in hands, shoulder  CMC of bilat thumbs - he is frequent golfer.  Has been taking glucosamine-chondroitin for some time, unsure of effect.  Has been using voltaren, incomplete relief.  Shoulder pain - worse after sleeping on side, L>R. Hx of debriding surgery on L shoulder.  Hx of reaction to piroxicam,but he was given this in the midst of an unrelated allergic reaction - has done well with OTC NSAIDs since.  Outpatient Medications Prior to Visit  Medication Sig Dispense Refill   Ascorbic Acid (VITAMIN C) 1000 MG tablet Take 1,000 mg by mouth daily.     diclofenac Sodium (VOLTAREN) 1 % GEL Apply 2 g topically 4 (four) times daily. 50 g 1   Glucosamine-Chondroit-Vit C-Mn (GLUCOSAMINE CHONDR 1500 COMPLX) CAPS Take 1 capsule by mouth in the morning and at bedtime.      Multiple Vitamins-Minerals (MULTIVITAMIN ADULTS 50+) TABS Take 1 tablet by mouth daily.     Omega-3 Fatty Acids (FISH OIL TRIPLE STRENGTH) 1400 MG CAPS Take 1 capsule by mouth daily.     levothyroxine (SYNTHROID) 75 MCG tablet Take 1 tablet by mouth once daily 90 tablet 0   simvastatin (ZOCOR) 20 MG tablet Take 1 tablet (20 mg  total) by mouth daily. 90 tablet 1   No facility-administered medications prior to visit.    Review of Systems  Constitutional: Negative.   HENT: Negative.    Eyes: Negative.   Respiratory: Negative.    Cardiovascular: Negative.   Gastrointestinal: Negative.   Genitourinary: Negative.   Musculoskeletal: Negative.   Skin: Negative.   Neurological: Negative.   Psychiatric/Behavioral: Negative.    All other systems reviewed and are negative.    Objective:     BP 132/76   Pulse 76   Temp 97.6 F (36.4 C) (Temporal)   Resp 18   Ht 5' 7.5" (1.715 m)   Wt 210 lb 6.4 oz (95.4 kg)   SpO2 97%   BMI 32.47 kg/m   Wt Readings from Last 3 Encounters:  05/17/22 210 lb 6.4 oz (95.4 kg)  11/17/21 221 lb (100.2 kg)  11/16/21 221 lb 3.2 oz (100.3 kg)   Physical Exam Constitutional:      General: He is not in acute distress.    Appearance: Normal appearance. He is normal weight. He is not ill-appearing, toxic-appearing or diaphoretic.  Cardiovascular:     Rate and Rhythm: Normal rate and regular rhythm.     Heart sounds: Normal heart sounds. No murmur heard.   No friction rub. No gallop.  Pulmonary:  Effort: Pulmonary effort is normal. No respiratory distress.     Breath sounds: Normal breath sounds. No stridor. No wheezing, rhonchi or rales.  Chest:     Chest wall: No tenderness.  Musculoskeletal:        General: No swelling, tenderness, deformity or signs of injury. Normal range of motion.     Right lower leg: No edema.     Left lower leg: No edema.     Comments: Mild pain in L shoulder with ROM  Skin:    General: Skin is warm and dry.  Neurological:     General: No focal deficit present.     Mental Status: He is alert and oriented to person, place, and time. Mental status is at baseline.  Psychiatric:        Mood and Affect: Mood normal.        Behavior: Behavior normal.        Thought Content: Thought content normal.        Judgment: Judgment normal.    No  results found for any visits on 05/17/22.    The 10-year ASCVD risk score (Arnett DK, et al., 2019) is: 16.3%    Assessment & Plan:   Problem List Items Addressed This Visit       Endocrine   Hypothyroidism - Primary    Labs collected. Will follow up with the patient as warranted.        Relevant Medications   levothyroxine (SYNTHROID) 75 MCG tablet   Other Relevant Orders   Comprehensive metabolic panel   Lipid panel   TSH   CBC with Differential/Platelet     Musculoskeletal and Integument   Polyarthritis    Will try diclofenac PO as above. Giving hydroxyzine in case of reaction. Advised to seek emergency care if reaction persists. Reviewed risks, benefits, and side effects, pt voices understanding.        Relevant Medications   diclofenac (VOLTAREN) 75 MG EC tablet   hydrOXYzine (ATARAX) 10 MG tablet     Other   Hyperlipidemia    Labs collected. Will follow up with the patient as warranted. Continue statin.       Relevant Medications   simvastatin (ZOCOR) 20 MG tablet   Other Relevant Orders   Comprehensive metabolic panel   Lipid panel   TSH   CBC with Differential/Platelet    Meds ordered this encounter  Medications   levothyroxine (SYNTHROID) 75 MCG tablet    Sig: Take 1 tablet (75 mcg total) by mouth daily.    Dispense:  90 tablet    Refill:  1    Order Specific Question:   Supervising Provider    Answer:   Neva SeatGREENE, JEFFREY R [[id number]]   simvastatin (ZOCOR) 20 MG tablet    Sig: Take 1 tablet (20 mg total) by mouth daily.    Dispense:  90 tablet    Refill:  1    Order Specific Question:   Supervising Provider    Answer:   Neva SeatGREENE, JEFFREY R [[id number]]   diclofenac (VOLTAREN) 75 MG EC tablet    Sig: Take 1 tablet (75 mg total) by mouth 2 (two) times daily.    Dispense:  30 tablet    Refill:  0    Order Specific Question:   Supervising Provider    Answer:   Neva SeatGREENE, JEFFREY R [[id number]]   hydrOXYzine (ATARAX) 10 MG tablet    Sig: Take 1 tablet (10 mg  total) by mouth 3 (three) times  daily as needed.    Dispense:  30 tablet    Refill:  0    Order Specific Question:   Supervising Provider    Answer:   Neva Seat, JEFFREY R [[id number]]    Return in about 6 months (around 11/16/2022) for Chronic Conditions.    Janeece Agee, NP

## 2022-05-17 NOTE — Patient Instructions (Addendum)
Mr. Daws -   Doristine Devoid to see you  Can try oral diclofenac once or twice daily as needed Take hydroxyzine if having any reaction, let me know if this happens  Will check on labs and let you know if we need to make any adjustments  Thanks,  Rich     If you have lab work done today you will be contacted with your lab results within the next 2 weeks.  If you have not heard from [place] then please contact us. The fastest way to get your results is to register for My Chart.   IF you received an x-ray today, you will receive an invoice from [facility]. Please contact [facility] at [phone] with questions or concerns regarding your invoice.   IF you received labwork today, you will receive an invoice from [facility]. Please contact [facility] at [phone] with questions or concerns regarding your invoice.   Our billing staff will not be able to assist you with questions regarding bills from these companies.  You will be contacted with the lab results as soon as they are available. The fastest way to get your results is to activate your My Chart account. Instructions are located on the last page of this paperwork. If you have not heard from [place] regarding the results in 2 weeks, please contact this office.

## 2022-08-28 ENCOUNTER — Encounter: Payer: Self-pay | Admitting: Family Medicine

## 2022-08-28 ENCOUNTER — Ambulatory Visit (INDEPENDENT_AMBULATORY_CARE_PROVIDER_SITE_OTHER): Payer: Medicare Other | Admitting: Family Medicine

## 2022-08-28 VITALS — BP 138/88 | HR 69 | Temp 97.8°F | Resp 17 | Ht 67.0 in | Wt 217.0 lb

## 2022-08-28 DIAGNOSIS — M2550 Pain in unspecified joint: Secondary | ICD-10-CM | POA: Diagnosis not present

## 2022-08-28 LAB — CBC WITH DIFFERENTIAL/PLATELET
Basophils Absolute: 0.1 10*3/uL (ref 0.0–0.1)
Basophils Relative: 1 % (ref 0.0–3.0)
Eosinophils Absolute: 0.3 10*3/uL (ref 0.0–0.7)
Eosinophils Relative: 5.2 % — ABNORMAL HIGH (ref 0.0–5.0)
HCT: 42.7 % (ref 39.0–52.0)
Hemoglobin: 14.5 g/dL (ref 13.0–17.0)
Lymphocytes Relative: 20.5 % (ref 12.0–46.0)
Lymphs Abs: 1.2 10*3/uL (ref 0.7–4.0)
MCHC: 33.8 g/dL (ref 30.0–36.0)
MCV: 87.5 fl (ref 78.0–100.0)
Monocytes Absolute: 0.4 10*3/uL (ref 0.1–1.0)
Monocytes Relative: 7.9 % (ref 3.0–12.0)
Neutro Abs: 3.7 10*3/uL (ref 1.4–7.7)
Neutrophils Relative %: 65.4 % (ref 43.0–77.0)
Platelets: 211 10*3/uL (ref 150.0–400.0)
RBC: 4.88 Mil/uL (ref 4.22–5.81)
RDW: 13.8 % (ref 11.5–15.5)
WBC: 5.7 10*3/uL (ref 4.0–10.5)

## 2022-08-28 LAB — URIC ACID: Uric Acid, Serum: 6.2 mg/dL (ref 4.0–7.8)

## 2022-08-28 MED ORDER — CELECOXIB 100 MG PO CAPS: 100.0000 mg | ORAL_CAPSULE | Freq: Every day | ORAL | 1 refills | Status: DC

## 2022-08-28 NOTE — Patient Instructions (Signed)
Follow up as needed or as scheduled We'll notify you of your lab results and make any changes if needed START the Celebrex once daily- take w/ food We placed the Rheumatology referral- they will call you to schedule Ice your hands to help w/ pain and stiffness Call with any questions or concerns Hang in there!

## 2022-08-28 NOTE — Progress Notes (Signed)
   Subjective:    Patient ID: Eric Kerr, male    DOB: 1951/01/17, 71 y.o.   MRN: [id number]  HPI Polyarthralgia- pt reports pain is primarily in hands bilaterally.  Has difficulty after playing golf.  Pt reports pain will wake him at night.  Denies swelling or deformity.  No redness or warmth.  Will take OTC meds occasionally, will also use Voltaren gel.   Review of Systems For ROS see HPI     Objective:   Physical Exam Vitals reviewed.  Constitutional:      General: He is not in acute distress.    Appearance: Normal appearance. He is obese. He is not ill-appearing.  HENT:     Head: Normocephalic and atraumatic.  Cardiovascular:     Pulses: Normal pulses.  Musculoskeletal:        General: No swelling or deformity.     Comments: R middle finger stiff at both PIP and DIP joints, more so than other fingers Pain at [name] joints bilaterally No ulnar deviation noted  Skin:    General: Skin is warm and dry.     Findings: No erythema or rash.  Neurological:     General: No focal deficit present.     Mental Status: He is alert and oriented to person, place, and time.  Psychiatric:        Mood and Affect: Mood normal.        Behavior: Behavior normal.        Thought Content: Thought content normal.           Assessment & Plan:  Polyarthralgia- new to provider, ongoing for pt.  He would like a referral to Rheumatology- already called them and was told referral needed.  Given that he is having bilateral hand sxs, will check Rheumatoid factor.  He reports that he has taken Ibuprofen and other NSAIDs w/o difficulty despite his allergy to Piroxicam.  Will start Celebrex daily to try and stay ahead of the pain.  Encouraged ice as needed.  Pt expressed understanding and is in agreement w/ plan.

## 2022-08-29 LAB — ANA: Anti Nuclear Antibody (ANA): NEGATIVE

## 2022-08-29 LAB — RHEUMATOID FACTOR: Rheumatoid fact SerPl-aCnc: <14 IU/mL (ref <14)

## 2022-08-30 ENCOUNTER — Telehealth: Payer: Self-pay

## 2022-08-30 NOTE — Telephone Encounter (Signed)
-----   Message from Sheliah Hatch, MD sent at 08/30/2022  7:07 AM EDT ----- No evidence of Rheumatoid Arthritis or other autoimmune process.  Good news!

## 2022-08-31 NOTE — Telephone Encounter (Signed)
Called pt back and informed  °

## 2022-08-31 NOTE — Telephone Encounter (Signed)
Yes, he should take the anti-inflammatory (Celebrex) as prescribed to see if pain improves

## 2022-11-15 ENCOUNTER — Ambulatory Visit: Payer: Medicare Other | Admitting: Registered Nurse

## 2023-01-08 ENCOUNTER — Other Ambulatory Visit: Payer: Self-pay

## 2023-01-08 DIAGNOSIS — E039 Hypothyroidism, unspecified: Secondary | ICD-10-CM

## 2023-01-08 DIAGNOSIS — E7800 Pure hypercholesterolemia, unspecified: Secondary | ICD-10-CM

## 2023-01-08 MED ORDER — LEVOTHYROXINE SODIUM 75 MCG PO TABS: 75.0000 ug | ORAL_TABLET | Freq: Every day | ORAL | 1 refills | Status: DC

## 2023-01-08 MED ORDER — SIMVASTATIN 20 MG PO TABS: 20.0000 mg | ORAL_TABLET | Freq: Every day | ORAL | 1 refills | Status: DC

## 2023-02-08 ENCOUNTER — Encounter: Payer: Self-pay | Admitting: Family Medicine

## 2023-02-08 ENCOUNTER — Ambulatory Visit (INDEPENDENT_AMBULATORY_CARE_PROVIDER_SITE_OTHER): Payer: Medicare Other | Admitting: Family Medicine

## 2023-02-08 VITALS — BP 124/70 | HR 70 | Temp 97.8°F | Ht 67.0 in | Wt 217.4 lb

## 2023-02-08 DIAGNOSIS — E7800 Pure hypercholesterolemia, unspecified: Secondary | ICD-10-CM

## 2023-02-08 DIAGNOSIS — E039 Hypothyroidism, unspecified: Secondary | ICD-10-CM

## 2023-02-08 DIAGNOSIS — M130 Polyarthritis, unspecified: Secondary | ICD-10-CM

## 2023-02-08 DIAGNOSIS — Z125 Encounter for screening for malignant neoplasm of prostate: Secondary | ICD-10-CM | POA: Diagnosis not present

## 2023-02-08 MED ORDER — LEVOTHYROXINE SODIUM 75 MCG PO TABS: 75.0000 ug | ORAL_TABLET | Freq: Every day | ORAL | 1 refills | Status: DC

## 2023-02-08 MED ORDER — SIMVASTATIN 20 MG PO TABS: 20.0000 mg | ORAL_TABLET | Freq: Every day | ORAL | 1 refills | Status: DC

## 2023-02-08 NOTE — Assessment & Plan Note (Signed)
Patient is taking Levothyroxine 24mg daily. Refilled medication. Ordered TSH level since last lab was checked in May 2023 and he is reporting more fatigue than usual.

## 2023-02-08 NOTE — Assessment & Plan Note (Signed)
Continue with Tylenol and Voltaren cream for pain.Continue with the two medications since he reports his pain is controlled. Denies needing a refill on Voltaren cream. Discontinued Celebrex since patient is not taking medication and reports it didn't help.

## 2023-02-08 NOTE — Assessment & Plan Note (Addendum)
Patient is taking Simvastatin 73m daily. He has no complaints with medication.Refilled medication. Ordered fasting lipid level since it was last obtained in 04/2022. He will come back to have labs drawn.

## 2023-02-08 NOTE — Progress Notes (Signed)
New Patient Office Visit  Subjective    Patient ID: Eric Kerr, male    DOB: 1951/11/17  Age: 72 y.o. MRN: [id number]  CC:  Chief Complaint  Patient presents with   Establish Care    Pt well needs refills     HPI Eric Kerr presents to establish care with new provider and chronic management.   Arthritis: Chronic. Discontinued Celebrex several months ago that was prescribed on previous visit. He reports the medication is not effective. He reports taking Tylenol almost daily. He reports it helps the pain. He does use Voltaren cream occasionally, but unsure if it does help. Since previous visit patient reports he did have a visit with rheumatologist, even with negative rheumatoid labs in our office. He reports there was no further treatment completed with RA specialist and does not have a follow up appointment.   Hypothyroidism: Chronic.Patient states  Patient takes Levothyroxine75 mcg daily. He reports more fatigue than usual. Denies any chest pain, shortness of breath, or hair loss. Last TSH was 4.91 on 05/17/2022.   Hyperlipidemia: Chronic. Patient is taking Simvastatin 58m daily. Denies any muscle cramps or complications with medication.  Lab Results  Component Value Date   CHOL 175 05/17/2022   HDL 64.80 05/17/2022   LDLCALC 93 05/17/2022   TRIG 84.0 05/17/2022   CHOLHDL 3 05/17/2022    Patient is requesting a PSA screening due to his father having a history of prostate cancer.   Outpatient Encounter Medications as of 02/08/2023  Medication Sig   Ascorbic Acid (VITAMIN C) 1000 MG tablet Take 1,000 mg by mouth daily.   diclofenac Sodium (VOLTAREN) 1 % GEL Apply 2 g topically 4 (four) times daily.   Glucosamine-Chondroit-Vit C-Mn (GLUCOSAMINE CHONDR 1500 COMPLX) CAPS Take 1 capsule by mouth in the morning and at bedtime.    Multiple Vitamins-Minerals (MULTIVITAMIN ADULTS 50+) TABS Take 1 tablet by mouth daily.   Omega-3 Fatty Acids (FISH OIL TRIPLE STRENGTH) 1400 MG CAPS  Take 1 capsule by mouth daily.   [DISCONTINUED] celecoxib (CELEBREX) 100 MG capsule Take 1 capsule (100 mg total) by mouth daily.   [DISCONTINUED] levothyroxine (SYNTHROID) 75 MCG tablet Take 1 tablet (75 mcg total) by mouth daily.   [DISCONTINUED] simvastatin (ZOCOR) 20 MG tablet Take 1 tablet (20 mg total) by mouth daily.   levothyroxine (SYNTHROID) 75 MCG tablet Take 1 tablet (75 mcg total) by mouth daily.   simvastatin (ZOCOR) 20 MG tablet Take 1 tablet (20 mg total) by mouth daily.   [DISCONTINUED] hydrOXYzine (ATARAX) 10 MG tablet Take 1 tablet (10 mg total) by mouth 3 (three) times daily as needed.   No facility-administered encounter medications on file as of 02/08/2023.    Past Medical History:  Diagnosis Date   Acute lateral meniscus tear of right knee    Allergy    Arthritis    Colon polyps    History of chickenpox as child   Hyperlipidemia    Hypothyroidism    Sleep apnea    could not tolerate cpap mild osa   Vertigo     Past Surgical History:  Procedure Laterality Date   COLONOSCOPY     JOINT REPLACEMENT  December, 2012   Left knee replaced   KNEE ARTHROSCOPY WITH SUBCHONDROPLASTY Right 09/28/2020   Procedure: RIGHT KNEE ARTHROSCOPY WITH PARTIAL MEDIAL MENISCECTOMY  AND MEDIAL FEMUR SUBCHONDROPLASTY;  Surgeon: RNicholes Stairs MD;  Location: [facility]  Service: Orthopedics;  Laterality: Right;  788ms   left shoulder  arthroscopy     MENISCUS REPAIR Right 03/2018   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   left tkr revision 2013   right shoulder tear repaired  yrs ago   [place]  as child    Family History  Problem Relation Age of Onset   COPD Mother    Cancer Father        Prostate    Social History   Socioeconomic History   Marital status: Married    Spouse name: Not on file   Number of children: 3   Years of education: Not on file   Highest education level: Not on file  Occupational History   Not on file   Tobacco Use   Smoking status: Former    Packs/day: 0.50    Years: 10.00    Total pack years: 5.00    Types: Cigarettes, Cigars    Quit date: 12/18/2008    Years since quitting: 14.1   Smokeless tobacco: Former    Types: Chew   Tobacco comments:    No current use  Vaping Use   Vaping Use: Never used  Substance and Sexual Activity   Alcohol use: Yes    Alcohol/week: 7.0 standard drinks of alcohol    Types: 7 Standard drinks or equivalent per week    Comment: 1 to 2 beers per day   Drug use: Never   Sexual activity: Not Currently  Other Topics Concern   Not on file  Social History Narrative   Retired.    Social Determinants of Health   Financial Resource Strain: Low Risk  (11/17/2021)   Overall Financial Resource Strain (CARDIA)    Difficulty of Paying Living Expenses: Not hard at all  Food Insecurity: No Food Insecurity (11/17/2021)   Hunger Vital Sign    Worried About Running Out of Food in the Last Year: Never true    Ran Out of Food in the Last Year: Never true  Transportation Needs: No Transportation Needs (11/17/2021)   PRAPARE - Hydrologist (Medical): No    Lack of Transportation (Non-Medical): No  Physical Activity: Sufficiently Active (11/17/2021)   Exercise Vital Sign    Days of Exercise per Week: 3 days    Minutes of Exercise per Session: 50 min  Stress: No Stress Concern Present (11/17/2021)   [place]    Feeling of Stress : Not at all  Social Connections: [place] (11/17/2021)   Social Connection and Isolation Panel [NHANES]    Frequency of Communication with Friends and Family: More than three times a week    Frequency of Social Gatherings with Friends and Family: More than three times a week    Attends Religious Services: More than 4 times per year    Active Member of [employer] or Organizations: Yes    Attends Music therapist: More than 4  times per year    Marital Status: Married  Human resources officer Violence: Not At Risk (11/17/2021)   Humiliation, Afraid, Rape, and Kick questionnaire    Fear of Current or Ex-Partner: No    Emotionally Abused: No    Physically Abused: No    Sexually Abused: No    ROS See HPI above    Objective    BP 124/70   Pulse 70   Temp 97.8 F (36.6 C) (Temporal)   Ht 5' 7"$  (1.702 m)   Wt 217 lb 6.4 oz (98.6 kg)  SpO2 95%   BMI 34.05 kg/m   Physical Exam Vitals reviewed.  Constitutional:      General: He is not in acute distress.    Appearance: Normal appearance. He is obese. He is not ill-appearing or toxic-appearing.  Eyes:     General:        Right eye: No discharge.        Left eye: No discharge.     Conjunctiva/sclera: Conjunctivae normal.  Cardiovascular:     Rate and Rhythm: Normal rate and regular rhythm.     Heart sounds: Normal heart sounds. No murmur heard.    No friction rub. No gallop.  Pulmonary:     Effort: Pulmonary effort is normal. No respiratory distress.     Breath sounds: Normal breath sounds.  Musculoskeletal:        General: Normal range of motion.  Skin:    General: Skin is warm and dry.  Neurological:     General: No focal deficit present.     Mental Status: He is alert and oriented to person, place, and time. Mental status is at baseline.  Psychiatric:        Mood and Affect: Mood normal.        Behavior: Behavior normal.        Thought Content: Thought content normal.        Judgment: Judgment normal.      Assessment & Plan:  Acquired hypothyroidism Assessment & Plan: Patient is taking Levothyroxine 76mg daily. Refilled medication. Ordered TSH level since last lab was checked in May 2023 and he is reporting more fatigue than usual.   Orders: -     Levothyroxine Sodium; Take 1 tablet (75 mcg total) by mouth daily.  Dispense: 90 tablet; Refill: 1 -     TSH; Future  Pure hypercholesterolemia Assessment & Plan: Patient is taking Simvastatin  251mdaily. He has no complaints with medication.Refilled medication. Ordered fasting lipid level since it was last obtained in 04/2022. He will come back to have labs drawn.   Orders: -     Simvastatin; Take 1 tablet (20 mg total) by mouth daily.  Dispense: 90 tablet; Refill: 1 -     Cholesterol, total; Future -     Comprehensive metabolic panel; Future  Polyarthritis Assessment & Plan: Continue with Tylenol and Voltaren cream for pain.Continue with the two medications since he reports his pain is controlled. Denies needing a refill on Voltaren cream. Discontinued Celebrex since patient is not taking medication and reports it didn't help.    Prostate cancer screening -     PSA, Medicare; Future    Return in about 6 months (around 08/09/2023) for chronic management.   -Recommended patient to get his Tdap vaccine at his local pharmacy due to the financial cost with primary care office.  -Patient requested PSA be checked due to father having prostate cancer. He denies any previous history of prostate problems. Last PSA was normal in 04/2021.He did sign waiver for financial responsibility if insurance does not cover cost.  -Follow up for AWV with health coach. He would like visit after 3pm.   -Follow up lab visit one morning since office lab is not available and patient does not want to have labs drawn at [facility]    JoValarie MerinoNP

## 2023-02-08 NOTE — Patient Instructions (Signed)
It was a pleasure to meet you today and I look forward to taking care of you! -Follow up for a 6 month chronic management, AWV visit with health coach, and lab visit to draw labs. Office will call with results.  -Refilled Simvastatin and Levothyroxine.   Take care!

## 2023-02-13 ENCOUNTER — Other Ambulatory Visit (INDEPENDENT_AMBULATORY_CARE_PROVIDER_SITE_OTHER): Payer: Medicare Other

## 2023-02-13 DIAGNOSIS — E039 Hypothyroidism, unspecified: Secondary | ICD-10-CM

## 2023-02-13 DIAGNOSIS — Z125 Encounter for screening for malignant neoplasm of prostate: Secondary | ICD-10-CM

## 2023-02-13 DIAGNOSIS — E7800 Pure hypercholesterolemia, unspecified: Secondary | ICD-10-CM

## 2023-02-14 LAB — COMPREHENSIVE METABOLIC PANEL
ALT: 32 U/L (ref 0–53)
AST: 26 U/L (ref 0–37)
Albumin: 4.2 g/dL (ref 3.5–5.2)
Alkaline Phosphatase: 73 U/L (ref 39–117)
BUN: 12 mg/dL (ref 6–23)
CO2: 31 mEq/L (ref 19–32)
Calcium: 10 mg/dL (ref 8.4–10.5)
Chloride: 101 mEq/L (ref 96–112)
Creatinine, Ser: 1.07 mg/dL (ref 0.40–1.50)
GFR: 70.53 mL/min (ref >60.00)
Glucose, Bld: 90 mg/dL (ref 70–99)
Potassium: 4.7 mEq/L (ref 3.5–5.1)
Sodium: 138 mEq/L (ref 135–145)
Total Bilirubin: 0.7 mg/dL (ref 0.2–1.2)
Total Protein: 7 g/dL (ref 6.0–8.3)

## 2023-02-14 LAB — TSH: TSH: 3 u[IU]/mL (ref 0.35–5.50)

## 2023-02-14 LAB — CHOLESTEROL, TOTAL: Cholesterol: 173 mg/dL (ref 0–200)

## 2023-02-14 LAB — PSA, MEDICARE: PSA: 2.24 ng/ml (ref 0.10–4.00)

## 2023-02-15 ENCOUNTER — Other Ambulatory Visit (INDEPENDENT_AMBULATORY_CARE_PROVIDER_SITE_OTHER): Payer: Medicare Other

## 2023-02-15 ENCOUNTER — Other Ambulatory Visit: Payer: Self-pay

## 2023-02-15 DIAGNOSIS — E7800 Pure hypercholesterolemia, unspecified: Secondary | ICD-10-CM | POA: Diagnosis not present

## 2023-02-15 LAB — LIPID PANEL
Cholesterol: 168 mg/dL (ref 0–200)
HDL: 63.7 mg/dL (ref >39.00)
LDL Cholesterol: 80 mg/dL (ref 0–99)
NonHDL: 104.36
Total CHOL/HDL Ratio: 3
Triglycerides: 123 mg/dL (ref 0.0–149.0)
VLDL: 24.6 mg/dL (ref 0.0–40.0)

## 2023-02-15 NOTE — Progress Notes (Signed)
Order placed and faxed back add on sheet

## 2023-03-12 ENCOUNTER — Other Ambulatory Visit: Payer: Self-pay

## 2023-03-12 DIAGNOSIS — M79609 Pain in unspecified limb: Secondary | ICD-10-CM | POA: Insufficient documentation

## 2023-03-12 DIAGNOSIS — Z6833 Body mass index (BMI) 33.0-33.9, adult: Secondary | ICD-10-CM | POA: Insufficient documentation

## 2023-03-12 DIAGNOSIS — M1991 Primary osteoarthritis, unspecified site: Secondary | ICD-10-CM | POA: Insufficient documentation

## 2023-03-12 DIAGNOSIS — M79646 Pain in unspecified finger(s): Secondary | ICD-10-CM | POA: Insufficient documentation

## 2023-03-13 ENCOUNTER — Other Ambulatory Visit: Payer: Self-pay

## 2023-03-14 ENCOUNTER — Telehealth: Payer: Self-pay | Admitting: Family Medicine

## 2023-03-14 NOTE — Telephone Encounter (Signed)
[phone] Call Type Message Only Information Provided Reason for Call Returning a Call from the Office Initial [place] states he is returning a call from [place]. Additional Comment Provided office hours. Disp. Time Disposition Final User 03/13/2023 4:22:13 PM General Information Provided Yes Hulan Amato Call Closed By: Hulan Amato Transaction Date/Time: 03/13/2023 4:19:04 PM (ET)

## 2023-03-14 NOTE — Telephone Encounter (Signed)
Contacted Eric Kerr to schedule their annual wellness visit. Appointment made for 04/12/2023.  Thank you,  [place] Direct dial  [phone]

## 2023-04-12 ENCOUNTER — Telehealth: Payer: Self-pay | Admitting: Family Medicine

## 2023-04-12 ENCOUNTER — Ambulatory Visit (INDEPENDENT_AMBULATORY_CARE_PROVIDER_SITE_OTHER): Payer: Medicare Other | Admitting: *Deleted

## 2023-04-12 ENCOUNTER — Telehealth: Payer: Self-pay | Admitting: *Deleted

## 2023-04-12 ENCOUNTER — Other Ambulatory Visit: Payer: Self-pay | Admitting: Family Medicine

## 2023-04-12 DIAGNOSIS — Z0000 Encounter for general adult medical examination without abnormal findings: Secondary | ICD-10-CM | POA: Diagnosis not present

## 2023-04-12 DIAGNOSIS — R220 Localized swelling, mass and lump, head: Secondary | ICD-10-CM

## 2023-04-12 NOTE — Progress Notes (Signed)
Eric Kerr , has been having some issue with swelling of lips, he had it in past but never found out cause.     Within the last 30 days he has had 3 episodes.    No Hives No Trouble breathing   He states he isn't sure if he has developed an allergy to something , he cannot pinpoint anything specific he is eating that is causing this.   He is concerned that if it gets worse he will develop breathing issues.     He takes benadryl at time of swelling.   He would like to see if he could get a referral to an allergist to have some testing done, to try to find out the cause.   Patient can be sent a [facility] message on how would like to proceed.   Patient was advised on signs when to go to emergency room and voiced understanding.      This note was provided on a telephone note by Remi Haggard, LPN that was sent to this provider. Reviewed medication list and patient is not on an ACE or ARB. Will place a referral to allergist. Also, will send Jonette Pesa, CMA message to call back the patient to inform patient that an referral has been placed.

## 2023-04-12 NOTE — Progress Notes (Signed)
Subjective:   Eric Kerr is a 72 y.o. male who presents for Medicare Annual/Subsequent preventive examination.  I connected with  Eric Kerr on 04/12/23 by a telephone enabled telemedicine application and verified that I am speaking with the correct person using two identifiers.   I discussed the limitations of evaluation and management by telemedicine. The patient expressed understanding and agreed to proceed.  Patient location: home  Provider location: telephone/home    Review of Systems     Cardiac Risk Factors include: advanced age (>65men, >2 women);male gender     Objective:    Today's Vitals   There is no height or weight on file to calculate BMI.     04/12/2023   10:36 AM 11/17/2021    3:58 PM 11/05/2020    9:35 AM 09/28/2020   11:05 AM 11/05/2019    9:37 AM  Advanced Directives  Does Patient Have a Medical Advance Directive? No Yes Yes Yes Yes  Type of Special educational needs teacher of Secretary;Living will Healthcare Power of [place];Living will Healthcare Power of [employer] of [place];Living will  Does patient want to make changes to medical advance directive?   Yes (ED - Information included in AVS) No - Patient declined   Copy of Healthcare Power of Attorney in Chart?  No - copy requested No - copy requested  No - copy requested  Would patient like information on creating a medical advance directive? No - Patient declined No - Patient declined       Current Medications (verified) Outpatient Encounter Medications as of 04/12/2023  Medication Sig   Ascorbic Acid (VITAMIN C) 1000 MG tablet Take 1,000 mg by mouth daily.   Glucosamine-Chondroit-Vit C-Mn (GLUCOSAMINE CHONDR 1500 COMPLX) CAPS Take 1 capsule by mouth in the morning and at bedtime.    levothyroxine (SYNTHROID) 75 MCG tablet Take 1 tablet (75 mcg total) by mouth daily.   Multiple Vitamins-Minerals (MULTIVITAMIN ADULTS 50+) TABS Take 1 tablet by mouth daily.   Omega-3  Fatty Acids (FISH OIL TRIPLE STRENGTH) 1400 MG CAPS Take 1 capsule by mouth daily.   simvastatin (ZOCOR) 20 MG tablet Take 1 tablet (20 mg total) by mouth daily.   Zinc 50 MG TABS 1 tablet Orally Once a day   celecoxib (CELEBREX) 100 MG capsule TAKE 1 CAPSULE BY MOUTH ONCE DAILY Oral for 30 Days   diclofenac Sodium (VOLTAREN) 1 % GEL Apply 2 g topically 4 (four) times daily.   No facility-administered encounter medications on file as of 04/12/2023.    Allergies (verified) Piroxicam   History: Past Medical History:  Diagnosis Date   Acute lateral meniscus tear of right knee    Allergy    Arthritis    Colon polyps    History of chickenpox as child   Hyperlipidemia    Hypothyroidism    Sleep apnea    could not tolerate cpap mild osa   Vertigo    Past Surgical History:  Procedure Laterality Date   COLONOSCOPY     JOINT REPLACEMENT  December, 2012   Left knee replaced   KNEE ARTHROSCOPY WITH SUBCHONDROPLASTY Right 09/28/2020   Procedure: RIGHT KNEE ARTHROSCOPY WITH PARTIAL MEDIAL MENISCECTOMY  AND MEDIAL FEMUR SUBCHONDROPLASTY;  Surgeon: Yolonda Kida, MD;  Location: [facility] [place];  Service: Orthopedics;  Laterality: Right;    left shoulder arthroscopy     MENISCUS REPAIR Right 03/2018   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   left tkr revision 2013  right shoulder tear repaired  yrs ago   TONSILLECTOMY AND ADENOIDECTOMY  as child   Family History  Problem Relation Age of Onset   COPD Mother    Cancer Father        Prostate   Social History   Socioeconomic History   Marital status: Married    Spouse name: Not on file   Number of children: 3   Years of education: Not on file   Highest education level: Not on file  Occupational History   Not on file  Tobacco Use   Smoking status: Former    Packs/day: 0.50    Years: 10.00    Additional pack years: 0.00    Total pack years: 5.00    Types: Cigarettes, Cigars    Quit date: 12/18/2008     Years since quitting: 14.3   Smokeless tobacco: Former    Types: Chew   Tobacco comments:    No current use  Vaping Use   Vaping Use: Never used  Substance and Sexual Activity   Alcohol use: Yes    Alcohol/week: 7.0 standard drinks of alcohol    Types: 7 Standard drinks or equivalent per week    Comment: 1 to 2 beers per day   Drug use: Never   Sexual activity: Not Currently  Other Topics Concern   Not on file  Social History Narrative   Retired.    Social Determinants of Health   Financial Resource Strain: Low Risk  (04/12/2023)   Overall Financial Resource Strain (CARDIA)    Difficulty of Paying Living Expenses: Not hard at all  Food Insecurity: No Food Insecurity (04/12/2023)   Hunger Vital Sign    Worried About Running Out of Food in the Last Year: Never true    Ran Out of Food in the Last Year: Never true  Transportation Needs: No Transportation Needs (04/12/2023)   PRAPARE - Administrator, Civil Service (Medical): No    Lack of Transportation (Non-Medical): No  Physical Activity: Sufficiently Active (04/12/2023)   Exercise Vital Sign    Days of Exercise per Week: 3 days    Minutes of Exercise per Session: 60 min  Stress: No Stress Concern Present (04/12/2023)   [employer] of Occupational Health - Occupational Stress Questionnaire    Feeling of Stress : Not at all  Social Connections: Moderately Integrated (04/12/2023)   Social Connection and Isolation Panel [NHANES]    Frequency of Communication with Friends and Family: Once a week    Frequency of Social Gatherings with Friends and Family: More than three times a week    Attends Religious Services: Never    Database administrator or Organizations: Yes    Attends Engineer, structural: More than 4 times per year    Marital Status: Married    Tobacco Counseling Counseling given: Not Answered Tobacco comments: No current use   Clinical Intake:  Pre-visit preparation completed:  Yes  Pain : No/denies pain     Diabetes: No  How often do you need to have someone help you when you read instructions, pamphlets, or other written materials from your doctor or pharmacy?: 1 - Never  Diabetic?  no  Interpreter Needed?: No  Information entered by :: Remi Haggard LPN   Activities of Daily Living    04/12/2023   10:34 AM 04/08/2023   12:43 PM  In your present state of health, do you have any difficulty performing the following activities:  Hearing?  0 0  Vision? 0 0  Difficulty concentrating or making decisions? 0 0  Walking or climbing stairs? 0 0  Dressing or bathing? 0 0  Doing errands, shopping? 0 0  Preparing Food and eating ? N N  Using the Toilet? N N  In the past six months, have you accidently leaked urine? N N  Do you have problems with loss of bowel control? N N  Managing your Medications? N N  Managing your Finances? N N  Housekeeping or managing your Housekeeping? N N    Patient Care Team: Alveria Apley, NP as PCP - General (Family Medicine)  Indicate any recent Medical Services you may have received from other than [facility] providers in the past year (date may be approximate).     Assessment:   This is a routine wellness examination for [employer].  Hearing/Vision screen Hearing Screening - Comments:: No trouble hearing Vision Screening - Comments:: Up to date [facility]  Dietary issues and exercise activities discussed:     Goals Addressed             This Visit's Progress    Weight (lb) < 200 lb (90.7 kg)         Depression Screen    04/12/2023   10:41 AM 02/08/2023    7:40 AM 08/28/2022   10:04 AM 05/17/2022   10:40 AM 05/17/2022   10:31 AM 11/17/2021    3:55 PM 11/16/2021    9:38 AM  PHQ 2/9 Scores  PHQ - 2 Score 1 0 0 0 0 0 0  PHQ- 9 Score 1 2 5 7  0 0 0    Fall Risk    04/12/2023   10:35 AM 04/08/2023   12:43 PM 02/08/2023    7:40 AM 08/28/2022   10:03 AM 05/17/2022   10:36 AM  Fall Risk   Falls in the past  year? 0 0 0 0 0  Number falls in past yr: 0  0 0 0  Injury with Fall? 0  0 0 0  Risk for fall due to :   No Fall Risks No Fall Risks No Fall Risks  Follow up Falls evaluation completed;Education provided;Falls prevention discussed  Falls evaluation completed Falls evaluation completed Falls evaluation completed    FALL RISK PREVENTION PERTAINING TO THE HOME:  Any stairs in or around the home? No  If so, are there any without handrails? No  Home free of loose throw rugs in walkways, pet beds, electrical cords, etc? Yes  Adequate lighting in your home to reduce risk of falls? Yes   ASSISTIVE DEVICES UTILIZED TO PREVENT FALLS:  Life alert? No  Use of a cane, walker or w/c? No  Grab bars in the bathroom? Yes  Shower chair or bench in shower? No  Elevated toilet seat or a handicapped toilet? No   TIMED UP AND GO:  Was the test performed? No .    Cognitive Function:    11/05/2019   11:51 AM  MMSE - Mini Mental State Exam  Orientation to time 5  Orientation to Place 5  Registration 3  Attention/ Calculation 5  Recall 3  Language- name 2 objects 2  Language- repeat 1  Language- follow 3 step command 3  Language- read & follow direction 1  Write a sentence 1  Copy design 1  Total score 30        04/12/2023   10:37 AM 11/17/2021    4:14 PM 11/17/2021  4:02 PM  6CIT Screen  What Year? 0 points 0 points 0 points  What month? 0 points 0 points 0 points  What time? 0 points 0 points 0 points  Count back from 20 0 points 0 points 0 points  Months in reverse 0 points 0 points 0 points  Repeat phrase 0 points 0 points 0 points  Total Score 0 points 0 points 0 points    Immunizations Immunization History  Administered Date(s) Administered   PFIZER(Purple Top)SARS-COV-2 Vaccination 02/07/2020, 03/03/2020   Pneumococcal Conjugate-13 11/05/2019   Pneumococcal Polysaccharide-23 01/07/2013, 11/05/2020    TDAP status: Due, Education has been provided regarding the  importance of this vaccine. Advised may receive this vaccine at local pharmacy or Health Dept. Aware to provide a copy of the vaccination record if obtained from local pharmacy or Health Dept. Verbalized acceptance and understanding.  Flu Vaccine status: Due, Education has been provided regarding the importance of this vaccine. Advised may receive this vaccine at local pharmacy or Health Dept. Aware to provide a copy of the vaccination record if obtained from local pharmacy or Health Dept. Verbalized acceptance and understanding.  Pneumococcal vaccine status: Up to date  Covid-19 vaccine status: Information provided on how to obtain vaccines.   Qualifies for Shingles Vaccine? Yes   Zostavax completed No   Shingrix Completed?: No.    Education has been provided regarding the importance of this vaccine. Patient has been advised to call insurance company to determine out of pocket expense if they have not yet received this vaccine. Advised may also receive vaccine at local pharmacy or Health Dept. Verbalized acceptance and understanding.  Screening Tests Health Maintenance  Topic Date Due   COVID-19 Vaccine (3 - 2023-24 season) 04/28/2023 (Originally 08/18/2022)   Zoster Vaccines- Shingrix (1 of 2) 05/09/2023 (Originally 01/19/2003)   INFLUENZA VACCINE  07/19/2023   Medicare Annual Wellness (AWV)  04/11/2024   COLONOSCOPY (Pts 45-102yrs Insurance coverage will need to be confirmed)  11/17/2028   Pneumonia Vaccine 1+ Years old  Completed   Hepatitis C Screening  Completed   HPV VACCINES  Aged Out   DTaP/Tdap/Td  Discontinued    Health Maintenance  There are no preventive care reminders to display for this patient.   Colorectal cancer screening: Type of screening: Colonoscopy. Completed 2019. Repeat every 10 years  Lung Cancer Screening: (Low Dose CT Chest recommended if Age 44-80 years, 30 pack-year currently smoking OR have quit w/in 15years.) does not qualify.   Lung Cancer Screening  Referral:   Additional Screening:  Hepatitis C Screening: does not qualify; Completed 2020  Vision Screening: Recommended annual ophthalmology exams for early detection of glaucoma and other disorders of the eye. Is the patient up to date with their annual eye exam?  Yes  Who is the provider or what is the name of the office in which the patient attends annual eye exams? [facility] If pt is not established with a provider, would they like to be referred to a provider to establish care? No .   Dental Screening: Recommended annual dental exams for proper oral hygiene  Community Resource Referral / Chronic Care Management: CRR required this visit?  No   CCM required this visit?  No      Plan:     I have personally reviewed and noted the following in the patient's chart:   Medical and social history Use of alcohol, tobacco or illicit drugs  Current medications and supplements including opioid prescriptions. Patient is not  currently taking opioid prescriptions. Functional ability and status Nutritional status Physical activity Advanced directives List of other physicians Hospitalizations, surgeries, and ER visits in previous 12 months Vitals Screenings to include cognitive, depression, and falls Referrals and appointments  In addition, I have reviewed and discussed with patient certain preventive protocols, quality metrics, and best practice recommendations. A written personalized care plan for preventive services as well as general preventive health recommendations were provided to patient.     Remi Haggard, LPN   1/61/0960   Nurse Notes:

## 2023-04-12 NOTE — Telephone Encounter (Signed)
Pt has been notified.

## 2023-04-12 NOTE — Patient Instructions (Signed)
Mr. Brandstetter , Thank you for taking time to come for your Medicare Wellness Visit. I appreciate your ongoing commitment to your health goals. Please review the following plan we discussed and let me know if I can assist you in the future.   Screening recommendations/referrals: Colonoscopy: up to date Recommended yearly ophthalmology/optometry visit for glaucoma screening and checkup Recommended yearly dental visit for hygiene and checkup  Vaccinations: Influenza vaccine: Education provided Pneumococcal vaccine: up to date Tdap vaccine: Education provided Shingles vaccine:           Preventive Care 65 Years and Older, Male Preventive care refers to lifestyle choices and visits with your health care provider that can promote health and wellness. What does preventive care include? A yearly physical exam. This is also called an annual well check. Dental exams once or twice a year. Routine eye exams. Ask your health care provider how often you should have your eyes checked. Personal lifestyle choices, including: Daily care of your teeth and gums. Regular physical activity. Eating a healthy diet. Avoiding tobacco and drug use. Limiting alcohol use. Practicing safe sex. Taking low doses of aspirin every day. Taking vitamin and mineral supplements as recommended by your health care provider. What happens during an annual well check? The services and screenings done by your health care provider during your annual well check will depend on your age, overall health, lifestyle risk factors, and family history of disease. Counseling  Your health care provider may ask you questions about your: Alcohol use. Tobacco use. Drug use. Emotional well-being. Home and relationship well-being. Sexual activity. Eating habits. History of falls. Memory and ability to understand (cognition). Work and work Astronomer. Screening  You may have the following tests or measurements: Height,  weight, and BMI. Blood pressure. Lipid and cholesterol levels. These may be checked every 5 years, or more frequently if you are over 64 years old. Skin check. Lung cancer screening. You may have this screening every year starting at age 54 if you have a 30-pack-year history of smoking and currently smoke or have quit within the past 15 years. Fecal occult blood test (FOBT) of the stool. You may have this test every year starting at age 73. Flexible sigmoidoscopy or colonoscopy. You may have a sigmoidoscopy every 5 years or a colonoscopy every 10 years starting at age 64. Prostate cancer screening. Recommendations will vary depending on your family history and other risks. Hepatitis C blood test. Hepatitis B blood test. Sexually transmitted disease (STD) testing. Diabetes screening. This is done by checking your blood sugar (glucose) after you have not eaten for a while (fasting). You may have this done every 1-3 years. Abdominal aortic aneurysm (AAA) screening. You may need this if you are a current or former smoker. Osteoporosis. You may be screened starting at age 78 if you are at high risk. Talk with your health care provider about your test results, treatment options, and if necessary, the need for more tests. Vaccines  Your health care provider may recommend certain vaccines, such as: Influenza vaccine. This is recommended every year. Tetanus, diphtheria, and acellular pertussis (Tdap, Td) vaccine. You may need a Td booster every 10 years. Zoster vaccine. You may need this after age 41. Pneumococcal 13-valent conjugate (PCV13) vaccine. One dose is recommended after age 30. Pneumococcal polysaccharide (PPSV23) vaccine. One dose is recommended after age 46. Talk to your health care provider about which screenings and vaccines you need and how often you need them. This information is not intended  to replace advice given to you by your health care provider. Make sure you discuss any  questions you have with your health care provider. Document Released: 12/31/2015 Document Revised: 08/23/2016 Document Reviewed: 10/05/2015 Elsevier Interactive Patient Education  2017 [employer] Prevention in the Home Falls can cause injuries. They can happen to people of all ages. There are many things you can do to make your home safe and to help prevent falls. What can I do on the outside of my home? Regularly fix the edges of walkways and driveways and fix any cracks. Remove anything that might make you trip as you walk through a door, such as a raised step or threshold. Trim any bushes or trees on the path to your home. Use bright outdoor lighting. Clear any walking paths of anything that might make someone trip, such as rocks or tools. Regularly check to see if handrails are loose or broken. Make sure that both sides of any steps have handrails. Any raised decks and porches should have guardrails on the edges. Have any leaves, snow, or ice cleared regularly. Use sand or salt on walking paths during winter. Clean up any spills in your garage right away. This includes oil or grease spills. What can I do in the bathroom? Use night lights. Install grab bars by the toilet and in the tub and shower. Do not use towel bars as grab bars. Use non-skid mats or decals in the tub or shower. If you need to sit down in the shower, use a plastic, non-slip stool. Keep the floor dry. Clean up any water that spills on the floor as soon as it happens. Remove soap buildup in the tub or shower regularly. Attach bath mats securely with double-sided non-slip rug tape. Do not have throw rugs and other things on the floor that can make you trip. What can I do in the bedroom? Use night lights. Make sure that you have a light by your bed that is easy to reach. Do not use any sheets or blankets that are too big for your bed. They should not hang down onto the floor. Have a firm chair that has side  arms. You can use this for support while you get dressed. Do not have throw rugs and other things on the floor that can make you trip. What can I do in the kitchen? Clean up any spills right away. Avoid walking on wet floors. Keep items that you use a lot in easy-to-reach places. If you need to reach something above you, use a strong step stool that has a grab bar. Keep electrical cords out of the way. Do not use floor polish or wax that makes floors slippery. If you must use wax, use non-skid floor wax. Do not have throw rugs and other things on the floor that can make you trip. What can I do with my stairs? Do not leave any items on the stairs. Make sure that there are handrails on both sides of the stairs and use them. Fix handrails that are broken or loose. Make sure that handrails are as long as the stairways. Check any carpeting to make sure that it is firmly attached to the stairs. Fix any carpet that is loose or worn. Avoid having throw rugs at the top or bottom of the stairs. If you do have throw rugs, attach them to the floor with carpet tape. Make sure that you have a light switch at the top of the stairs and  the bottom of the stairs. If you do not have them, ask someone to add them for you. What else can I do to help prevent falls? Wear shoes that: Do not have high heels. Have rubber bottoms. Are comfortable and fit you well. Are closed at the toe. Do not wear sandals. If you use a stepladder: Make sure that it is fully opened. Do not climb a closed stepladder. Make sure that both sides of the stepladder are locked into place. Ask someone to hold it for you, if possible. Clearly [place] and make sure that you can see: Any grab bars or handrails. First and last steps. Where the edge of each step is. Use tools that help you move around (mobility aids) if they are needed. These include: Canes. Walkers. Scooters. Crutches. Turn on the lights when you go into a dark area.  Replace any light bulbs as soon as they burn out. Set up your furniture so you have a clear path. Avoid moving your furniture around. If any of your floors are uneven, fix them. If there are any pets around you, be aware of where they are. Review your medicines with your doctor. Some medicines can make you feel dizzy. This can increase your chance of falling. Ask your doctor what other things that you can do to help prevent falls. This information is not intended to replace advice given to you by your health care provider. Make sure you discuss any questions you have with your health care provider. Document Released: 09/30/2009 Document Revised: 05/11/2016 Document Reviewed: 01/08/2015 Elsevier Interactive Patient Education  2017 [employer].

## 2023-04-12 NOTE — Telephone Encounter (Signed)
Left vm for pt to call office 

## 2023-04-12 NOTE — Telephone Encounter (Signed)
Eric Kerr , has been having some issue with swelling of lips, he had it in past but never found out cause.    Within the last 30 days he has had 3 episodes.   No Hives No Trouble breathing  He states he isn't sure if he has developed an allergy to something , he cannot pinpoint anything specific he is eating that is causing this.   He is concerned that if it gets worse he will develop breathing issues.    He takes benadryl at time of swelling.  He would like to see if he could get a referral to an allergist to have some testing done, to try to find out the cause.   Patient can be sent a [facility] message on how would like to proceed.  Patient was advised on signs when to go to emergency room and voiced understanding.

## 2023-05-21 ENCOUNTER — Other Ambulatory Visit: Payer: Self-pay

## 2023-05-21 ENCOUNTER — Encounter: Payer: Self-pay | Admitting: Allergy & Immunology

## 2023-05-21 ENCOUNTER — Ambulatory Visit (INDEPENDENT_AMBULATORY_CARE_PROVIDER_SITE_OTHER): Payer: Medicare Other | Admitting: Allergy & Immunology

## 2023-05-21 ENCOUNTER — Telehealth: Payer: Self-pay

## 2023-05-21 VITALS — BP 132/82 | HR 76 | Temp 98.0°F | Resp 18 | Ht 68.0 in | Wt 214.6 lb

## 2023-05-21 DIAGNOSIS — L239 Allergic contact dermatitis, unspecified cause: Secondary | ICD-10-CM

## 2023-05-21 DIAGNOSIS — L500 Allergic urticaria: Secondary | ICD-10-CM

## 2023-05-21 DIAGNOSIS — J3089 Other allergic rhinitis: Secondary | ICD-10-CM

## 2023-05-21 DIAGNOSIS — J302 Other seasonal allergic rhinitis: Secondary | ICD-10-CM | POA: Diagnosis not present

## 2023-05-21 MED ORDER — EPINEPHRINE 0.3 MG/0.3ML IJ SOAJ: 0.3000 mg | Freq: Once | INTRAMUSCULAR | 2 refills | Status: AC

## 2023-05-21 MED ORDER — FAMOTIDINE 20 MG PO TABS: 20.0000 mg | ORAL_TABLET | Freq: Two times a day (BID) | ORAL | 5 refills | Status: DC

## 2023-05-21 MED ORDER — LEVOCETIRIZINE DIHYDROCHLORIDE 5 MG PO TABS
5.0000 mg | ORAL_TABLET | Freq: Every evening | ORAL | 5 refills | Status: DC
Start: 2023-05-21 — End: 2023-05-28

## 2023-05-21 NOTE — Telephone Encounter (Signed)
Patient called in stating that he got a message from the pharmacy stating that his medications that were sent in was $245. I informed patient that both xyzal and pepcid are available over the counter if not covered by insurance. I informed patient to call the pharmacy and find out what wasn't covered and let us know.

## 2023-05-21 NOTE — Patient Instructions (Addendum)
1. Seasonal and perennial allergic rhinitis - Testing today showed: grasses, ragweed, weeds, trees, cat, dog, and mixed feathers - Copy of test results provided.  - Avoidance measures provided. - Start taking: antihistamines as below for the hives (Xyzal twice daily) - You can use an extra dose of the antihistamine, if needed, for breakthrough symptoms.  - Consider nasal saline rinses 1-2 times daily to remove allergens from the nasal cavities as well as help with mucous clearance (this is especially helpful to do before the nasal sprays are given) - Consider allergy shots as a means of long-term control. - Allergy shots "re-train" and "reset" the immune system to ignore environmental allergens and decrease the resulting immune response to those allergens (sneezing, itchy watery eyes, runny nose, nasal congestion, etc).    - Allergy shots improve symptoms in 75-85% of patients.  - We can discuss more at the next appointment if the medications are not working for you  2. Urticaria with angioedema - Your history does not have any "red flags" such as fevers, joint pains, or permanent skin changes that would be concerning for a more serious cause of hives.  - We will get some labs to rule out serious causes of hives: alpha gal panel, complete blood count, tryptase level, chronic urticaria panel, CMP, ESR, and CRP. - Chronic hives are often times a self limited process and will "burn themselves out" over 6-12 months, although this is not always the case.  - We will give an EpiPen out of an abundance of caution.  - Emergency Action Plan provided and EpiPen training provided. - In the meantime, start suppressive dosing of antihistamines:   - Morning: Xyzal (levocetirizine) 5 mg + Pepcid (famotidine) 20mg   - Evening: Xyzal (levocetirizine) 5 mg + Pepcid (famotidine) 20mg  - You can change this dosing at home, decreasing the dose as needed or increasing the dosing as needed.  - If you are not tolerating  the medications or are tired of taking them every day, we can start treatment with a monthly injectable medication called Xolair.   3. Return in about 2 months (around 07/21/2023). You can have the follow up appointment with Dr. Dellis Anes or a Nurse Practicioner (our Nurse Practitioners are excellent and always have Physician oversight!).    Please inform us of any Emergency Department visits, hospitalizations, or changes in symptoms. Call us before going to the ED for breathing or allergy symptoms since we might be able to fit you in for a sick visit. Feel free to contact us anytime with any questions, problems, or concerns.  It was a pleasure to meet you today!  Websites that have reliable patient information: 1. American Academy of Asthma, Allergy, and Immunology: www.aaaai.org 2. Food Allergy Research and Education (FARE): foodallergy.org 3. Mothers of Asthmatics: http://www.asthmacommunitynetwork.org 4. American College of Allergy, Asthma, and Immunology: www.acaai.org   COVID-19 Vaccine Information can be found at: [email or web address] For questions related to vaccine distribution or appointments, please email [email or web address] or call [phone].   We realize that you might be concerned about having an allergic reaction to the COVID19 vaccines. To help with that concern, WE ARE OFFERING THE COVID19 VACCINES IN OUR OFFICE! Ask the front desk for dates!     "Like" [place] on Facebook and Instagram for our latest updates!      A healthy democracy works best when [employer] participate! Make sure you are registered to vote! If you have moved or changed any of your contact information, you will need to  get this updated before voting!  In some cases, you MAY be able to register to vote online: [email or web address]        Airborne Adult Perc - 05/21/23 1029     Time Antigen Placed 1029     Allergen Manufacturer Waynette Buttery    Location Back    Number of Test 55    1. Control-Buffer 50% Glycerol Negative    2. Control-Histamine 2+    3. Bahia Negative    4. [place] 2+    5. Johnson 2+    6. [place] Blue 2+    7. Meadow Fescue 2+    8. Perennial Rye 2+    9. Timothy 3+    10. Ragweed Mix 3+    11. Cocklebur Negative    12. Plantain,  English 2+    13. Baccharis Negative    14. Dog Fennel Negative    15. Russian Thistle Negative    16. Lamb's Quarters Negative    17. Sheep Sorrell Negative    18. Rough Pigweed Negative    19. Marsh Elder, Rough Negative    20. Mugwort, Common Negative    21. Box, Elder Negative    22. Cedar, red Negative    23. Sweet Gum 2+    24. Pecan Pollen 2+    25. Pine Mix 2+    26. Walnut, Black Pollen 2+    27. Red Mulberry 2+    28. Ash Mix Negative    29. Birch Mix 3+    30. Beech American 2+    31. Cottonwood, [place] 2+    32. Hickory, White 2+    33. Maple Mix Negative    34. Oak, [place] Mix 3+    35. Sycamore Eastern 3+    36. Alternaria Alternata Negative    37. Cladosporium Herbarum Negative    38. Aspergillus Mix Negative    39. Penicillium Mix Negative    40. Bipolaris Sorokiniana (Helminthosporium) Negative    41. Drechslera Spicifera (Curvularia) Negative    42. Mucor Plumbeus Negative    43. Fusarium Moniliforme Negative    44. Aureobasidium Pullulans (pullulara) Negative    45. Rhizopus Oryzae Negative    46. Botrytis Cinera Negative    47. Epicoccum Nigrum Negative    48. Phoma Betae Negative    49. Dust Mite Mix Negative    50. Cat Hair 10,000 BAU/ml 2+    51.  Dog Epithelia 2+    52. Mixed Feathers --   +/-   53. Horse Epithelia Negative    54. Cockroach, German Negative    55. Tobacco Leaf Negative             Food Adult Perc - 05/21/23 1000     Time Antigen Placed 1029    Allergen Manufacturer Waynette Buttery    Location Back    Number of allergen test 17    Control-Histamine 2+    1. Peanut Negative     2. Soybean Negative    3. Wheat Negative    4. Sesame Negative    5. Milk, Cow Negative    6. Casein Negative    7. Egg White, Chicken --   +/-   8. Shellfish Mix Negative    9. Fish Mix Negative    10. Cashew Negative    11. Walnut Food Negative    12. Almond Negative    13. Hazelnut Negative    14. Pecan Food Negative    15. Pistachio  Negative    16. [place] Nut Negative    17. Coconut Negative            Reducing Pollen Exposure  The American Academy of Allergy, Asthma and Immunology suggests the following steps to reduce your exposure to pollen during allergy seasons.    Do not hang sheets or clothing out to dry; pollen may collect on these items. Do not mow lawns or spend time around freshly cut grass; mowing stirs up pollen. Keep windows closed at night.  Keep car windows closed while driving. Minimize morning activities outdoors, a time when pollen counts are usually at their highest. Stay indoors as much as possible when pollen counts or humidity is high and on windy days when pollen tends to remain in the air longer. Use air conditioning when possible.  Many air conditioners have filters that trap the pollen spores. Use a HEPA room air filter to remove pollen form the indoor air you breathe.  Control of Dog or Cat Allergen  Avoidance is the best way to manage a dog or cat allergy. If you have a dog or cat and are allergic to dog or cats, consider removing the dog or cat from the home. If you have a dog or cat but don't want to find it a new home, or if your family wants a pet even though someone in the household is allergic, here are some strategies that may help keep symptoms at bay:  Keep the pet out of your bedroom and restrict it to only a few rooms. Be advised that keeping the dog or cat in only one room will not limit the allergens to that room. Don't pet, hug or kiss the dog or cat; if you do, wash your hands with soap and water. High-efficiency particulate air  (HEPA) cleaners run continuously in a bedroom or living room can reduce allergen levels over time. Regular use of a high-efficiency vacuum cleaner or a central vacuum can reduce allergen levels. Giving your dog or cat a bath at least once a week can reduce airborne allergen.  Allergy Shots  Allergies are the result of a chain reaction that starts in the immune system. Your immune system controls how your body defends itself. For instance, if you have an allergy to pollen, your immune system identifies pollen as an invader or allergen. Your immune system overreacts by producing antibodies called Immunoglobulin E (IgE). These antibodies travel to cells that release chemicals, causing an allergic reaction.  The concept behind allergy immunotherapy, whether it is received in the form of shots or tablets, is that the immune system can be desensitized to specific allergens that trigger allergy symptoms. Although it requires time and patience, the payback can be long-term relief. Allergy injections contain a dilute solution of those substances that you are allergic to based upon your skin testing and allergy history.   How Do Allergy Shots Work?  Allergy shots work much like a vaccine. Your body responds to injected amounts of a particular allergen given in increasing doses, eventually developing a resistance and tolerance to it. Allergy shots can lead to decreased, minimal or no allergy symptoms.  There generally are two phases: build-up and maintenance. Build-up often ranges from three to six months and involves receiving injections with increasing amounts of the allergens. The shots are typically given once or twice a week, though more rapid build-up schedules are sometimes used.  The maintenance phase begins when the most effective dose is reached. This dose is  different for each person, depending on how allergic you are and your response to the build-up injections. Once the maintenance dose is reached,  there are longer periods between injections, typically two to four weeks.  Occasionally doctors give cortisone-type shots that can temporarily reduce allergy symptoms. These types of shots are different and should not be confused with allergy immunotherapy shots.  Who Can Be Treated with Allergy Shots?  Allergy shots may be a good treatment approach for people with allergic rhinitis (hay fever), allergic asthma, conjunctivitis (eye allergy) or stinging insect allergy.   Before deciding to begin allergy shots, you should consider:   The length of allergy season and the severity of your symptoms  Whether medications and/or changes to your environment can control your symptoms  Your desire to avoid long-term medication use  Time: allergy immunotherapy requires a major time commitment  Cost: may vary depending on your insurance coverage  Allergy shots for children age 75 and older are effective and often well tolerated. They might prevent the onset of new allergen sensitivities or the progression to asthma.  Allergy shots are not started on patients who are pregnant but can be continued on patients who become pregnant while receiving them. In some patients with other medical conditions or who take certain common medications, allergy shots may be of risk. It is important to mention other medications you talk to your allergist.   What are the two types of build-ups offered:   RUSH or Rapid Desensitization -- one day of injections lasting from 8:30-4:30pm, injections every 1 hour.  Approximately half of the build-up process is completed in that one day.  The following week, normal build-up is resumed, and this entails ~16 visits either weekly or twice weekly, until reaching your "maintenance dose" which is continued weekly until eventually getting spaced out to every month for a duration of 3 to 5 years. The regular build-up appointments are nurse visits where the injections are administered,  followed by required monitoring for 30 minutes.    Traditional build-up -- weekly visits for 6 -12 months until reaching "maintenance dose", then continue weekly until eventually spacing out to every 4 weeks as above. At these appointments, the injections are administered, followed by required monitoring for 30 minutes.     Either way is acceptable, and both are equally effective. With the rush protocol, the advantage is that less time is spent here for injections overall AND you would also reach maintenance dosing faster (which is when the clinical benefit starts to become more apparent). Not everyone is a candidate for rapid desensitization.   IF we proceed with the RUSH protocol, there are premedications which must be taken the day before and the day after the rush only (this includes antihistamines, steroids, and Singulair).  After the rush day, no prednisone or Singulair is required, and we just recommend antihistamines taken on your injection day.  What Is An Estimate of the Costs?  If you are interested in starting allergy injections, please check with your insurance company about your coverage for both allergy vial sets and allergy injections.  Please do so prior to making the appointment to start injections.  The following are CPT codes to give to your insurance company. These are the amounts we BILL to the insurance company, but the amount YOU WILL PAY and WE RECEIVE IS SUBSTANTIALLY LESS and depends on the contracts we have with different insurance companies.   Amount Billed to Insurance One allergy vial set  CPT 95165   $  1200     Two allergy vial set  CPT 95165   $ 2400     Three allergy vial set  CPT 95165   $ 3600     One injection   CPT 95115   $ 35  Two injections   CPT 95117   $ 40 RUSH (Rapid Desensitization) CPT 95180 x 8 hours $500/hour  Regarding the allergy injections, your co-pay may or may not apply with each injection, so please confirm this with your insurance company.  When you start allergy injections, 1 or 2 sets of vials are made based on your allergies.  Not all patients can be on one set of vials. A set of vials lasts 6 months to a year depending on how quickly you can proceed with your build-up of your allergy injections. Vials are personalized for each patient depending on their specific allergens.  How often are allergy injection given during the build-up period?   Injections are given at least weekly during the build-up period until your maintenance dose is achieved. Per the doctor's discretion, you may have the option of getting allergy injections two times per week during the build-up period. However, there must be at least 48 hours between injections. The build-up period is usually completed within 6-12 months depending on your ability to schedule injections and for adjustments for reactions. When maintenance dose is reached, your injection schedule is gradually changed to every two weeks and later to every three weeks. Injections will then continue every 4 weeks. Usually, injections are continued for a total of 3-5 years.   When Will I Feel Better?  Some may experience decreased allergy symptoms during the build-up phase. For others, it may take as long as 12 months on the maintenance dose. If there is no improvement after a year of maintenance, your allergist will discuss other treatment options with you.  If you aren't responding to allergy shots, it may be because there is not enough dose of the allergen in your vaccine or there are missing allergens that were not identified during your allergy testing. Other reasons could be that there are high levels of the allergen in your environment or major exposure to non-allergic triggers like tobacco smoke.  What Is the Length of Treatment?  Once the maintenance dose is reached, allergy shots are generally continued for three to five years. The decision to stop should be discussed with your allergist at that  time. Some people may experience a permanent reduction of allergy symptoms. Others may relapse and a longer course of allergy shots can be considered.  What Are the Possible Reactions?  The two types of adverse reactions that can occur with allergy shots are local and systemic. Common local reactions include very mild redness and swelling at the injection site, which can happen immediately or several hours after. Report a delayed reaction from your last injection. These include arm swelling or runny nose, watery eyes or cough that occurs within 12-24 hours after injection. A systemic reaction, which is less common, affects the entire body or a particular body system. They are usually mild and typically respond quickly to medications. Signs include increased allergy symptoms such as sneezing, a stuffy nose or hives.   Rarely, a serious systemic reaction called anaphylaxis can develop. Symptoms include swelling in the throat, wheezing, a feeling of tightness in the chest, nausea or dizziness. Most serious systemic reactions develop within 30 minutes of allergy shots. This is why it is strongly recommended you wait  in your doctor's office for 30 minutes after your injections. Your allergist is trained to watch for reactions, and his or her staff is trained and equipped with the proper medications to identify and treat them.   Report to the nurse immediately if you experience any of the following symptoms: swelling, itching or redness of the skin, hives, watery eyes/nose, breathing difficulty, excessive sneezing, coughing, stomach pain, diarrhea, or light headedness. These symptoms may occur within 15-20 minutes after injection and may require medication.   Who Should Administer Allergy Shots?  The preferred location for receiving shots is your prescribing allergist's office. Injections can sometimes be given at another facility where the physician and staff are trained to recognize and treat reactions, and  have received instructions by your prescribing allergist.  What if I am late for an injection?   Injection dose will be adjusted depending upon how many days or weeks you are late for your injection.   What if I am sick?   Please report any illness to the nurse before receiving injections. She may adjust your dose or postpone injections depending on your symptoms. If you have fever, flu, sinus infection or chest congestion it is best to postpone allergy injections until you are better. Never get an allergy injection if your asthma is causing you problems. If your symptoms persist, seek out medical care to get your health problem under control.  What If I am or Become Pregnant:  Women that become pregnant should schedule an appointment with [facility] before receiving any further allergy injections.

## 2023-05-21 NOTE — Progress Notes (Signed)
NEW PATIENT  Date of Service/Encounter:  05/21/23  Consult requested by: Alveria Apley, NP   Assessment:   Seasonal and perennial allergic rhinitis (grasses, ragweed, weeds, trees, cat, dog, and mixed feathers)  Allergic urticaria   Plan/Recommendations:   1. Seasonal and perennial allergic rhinitis - Testing today showed: grasses, ragweed, weeds, trees, cat, dog, and mixed feathers - Copy of test results provided.  - Avoidance measures provided. - Start taking: antihistamines as below for the hives (Xyzal twice daily) - You can use an extra dose of the antihistamine, if needed, for breakthrough symptoms.  - Consider nasal saline rinses 1-2 times daily to remove allergens from the nasal cavities as well as help with mucous clearance (this is especially helpful to do before the nasal sprays are given) - Consider allergy shots as a means of long-term control. - Allergy shots "re-train" and "reset" the immune system to ignore environmental allergens and decrease the resulting immune response to those allergens (sneezing, itchy watery eyes, runny nose, nasal congestion, etc).    - Allergy shots improve symptoms in 75-85% of patients.  - We can discuss more at the next appointment if the medications are not working for you  2. Urticaria with angioedema - Your history does not have any "red flags" such as fevers, joint pains, or permanent skin changes that would be concerning for a more serious cause of hives.  - We will get some labs to rule out serious causes of hives: alpha gal panel, complete blood count, tryptase level, chronic urticaria panel, CMP, ESR, and CRP. - Chronic hives are often times a self limited process and will "burn themselves out" over 6-12 months, although this is not always the case.  - We will give an EpiPen out of an abundance of caution.  - Emergency Action Plan provided and EpiPen training provided. - In the meantime, start suppressive dosing of  antihistamines:   - Morning: Xyzal (levocetirizine) 5 mg + Pepcid (famotidine) 20mg   - Evening: Xyzal (levocetirizine) 5 mg + Pepcid (famotidine) 20mg  - You can change this dosing at home, decreasing the dose as needed or increasing the dosing as needed.  - If you are not tolerating the medications or are tired of taking them every day, we can start treatment with a monthly injectable medication called Xolair.   3. Return in about 2 months (around 07/21/2023). You can have the follow up appointment with Dr. Dellis Anes or a Nurse Practicioner (our Nurse Practitioners are excellent and always have Physician oversight!).     This note in its entirety was forwarded to the Provider who requested this consultation.  Subjective:   Eric Kerr is a 72 y.o. male presenting today for evaluation of  Chief Complaint  Patient presents with   Angioedema    Occurred in the lips and face about a week ago. Randomly occurs. Went to the ED for this same reason about fifteen years ago in [name].   Urticaria    Says he get flare ups randomly as well.     Eric Kerr has a history of the following: Patient Active Problem List   Diagnosis Date Noted   Body mass index (BMI) 33.0-33.9, adult 03/12/2023   Pain in finger 03/12/2023   Pain in limb 03/12/2023   Primary osteoarthritis 03/12/2023   Polyarthritis 05/17/2022   Hyperlipidemia 09/09/2019   Hypothyroidism 09/09/2019   Osteoarthritis of left knee 09/09/2019   History of colon polyps 09/09/2019    History obtained from: chart review  and patient.  Eric Kerr was referred by Alveria Apley, NP.     Eric Kerr is a 72 y.o. male presenting for an evaluation of urticaria and angioedema .  He started having swelling 2 months ago.  This swelling involves his upper and lower lips.  He was not doing anything out of the ordinary where it is when it started.  He can kind of feel it coming on with some tingling.  His last 1 was 1 week ago.  He typically  treats it with 1 tablet of 25 mg Benadryl and it resolves over the course of 1 hour.  He will have hives, although these are not necessarily associated with episodes of angioedema.  When the hives resolved, they leave normal skin.  He does have some baseline joint pain and has seen rheumatology in the past.  He does not have any fevers.  As far he is swelling has not involved his airway, but his wife is concerned about it which is one of the reasons he is here today.  There have been no changes to his environment.  They have no change in laundry detergents, soaps, shampoos, or foods. He has not been started on any new medications. He tolerates all of the major food allergens without adverse event.   Interestingly, this actually happened 15 years ago as well.  He had several spells of angioedema and urticaria but then they resolved and were quiescent for around 15 years.  He did have to go to the emergency room 15 years ago, but he has not been to the emergency room for these current episodes.   Allergic Rhinitis Symptom History: He does have sneezing as well as rhinorrhea.  This has been ongoing for several years, likely since he was a child.  He did have testing when he was in the [employer]	[place] for 4 years in the 1970s and was positive to "everything".  He never had any allergy shots performed.  He does not take anything on a routine basis for his allergies.  He does not get antibiotics frequently.  Skin Symptom History: He does have a history of sensitive skin.  He has never undergone patch testing.  He reports that he reacts to metals including his watchband. He is using some tan-colored cohesive bandage tape around his wrist to protect his skin from his watchband.  Otherwise, he develops contact dermatitis.  Otherwise, there is no history of other atopic diseases, including drug allergies, stinging insect allergies, or contact dermatitis. There is no significant infectious history. Vaccinations are up  to date.    Past Medical History: Patient Active Problem List   Diagnosis Date Noted   Body mass index (BMI) 33.0-33.9, adult 03/12/2023   Pain in finger 03/12/2023   Pain in limb 03/12/2023   Primary osteoarthritis 03/12/2023   Polyarthritis 05/17/2022   Hyperlipidemia 09/09/2019   Hypothyroidism 09/09/2019   Osteoarthritis of left knee 09/09/2019   History of colon polyps 09/09/2019    Medication List:  Allergies as of 05/21/2023       Reactions   Piroxicam Hives, Itching        Medication List        Accurate as of May 21, 2023 12:00 PM. If you have any questions, ask your nurse or doctor.          STOP taking these medications    celecoxib 100 MG capsule Commonly known as: CELEBREX Stopped by: Alfonse Spruce, MD   diclofenac Sodium 1 %  Gel Commonly known as: VOLTAREN Stopped by: Alfonse Spruce, MD       TAKE these medications    Creatine Monohydrate 1.5 GM/5ML Liqd Take 1 mL by mouth daily.   EPINEPHrine 0.3 mg/0.3 mL Soaj injection Commonly known as: EpiPen 2-Pak Inject 0.3 mg into the muscle once for 1 dose. Started by: Alfonse Spruce, MD   famotidine 20 MG tablet Commonly known as: Pepcid Take 1 tablet (20 mg total) by mouth 2 (two) times daily. Started by: Alfonse Spruce, MD   Fish Oil Triple Strength 1400 MG Caps Take 1 capsule by mouth daily.   Glucosamine Chondr 1500 Complx Caps Take 1 capsule by mouth in the morning and at bedtime.   Iron 18 MG Tbcr Take 1 tablet by mouth daily.   levocetirizine 5 MG tablet Commonly known as: XYZAL Take 1 tablet (5 mg total) by mouth every evening. Started by: Alfonse Spruce, MD   levothyroxine 75 MCG tablet Commonly known as: SYNTHROID Take 1 tablet (75 mcg total) by mouth daily.   Multivitamin Adults 50+ Tabs Take 1 tablet by mouth daily.   simvastatin 20 MG tablet Commonly known as: ZOCOR Take 1 tablet (20 mg total) by mouth daily.   vitamin C 1000 MG  tablet Take 1,000 mg by mouth daily.   Zinc 50 MG Tabs 1 tablet Orally Once a day        Birth History: non-contributory  Developmental History: non-contributory  Past Surgical History: Past Surgical History:  Procedure Laterality Date   COLONOSCOPY     JOINT REPLACEMENT  December, 2012   Left knee replaced   KNEE ARTHROSCOPY WITH SUBCHONDROPLASTY Right 09/28/2020   Procedure: RIGHT KNEE ARTHROSCOPY WITH PARTIAL MEDIAL MENISCECTOMY  AND MEDIAL FEMUR SUBCHONDROPLASTY;  Surgeon: Yolonda Kida, MD;  Location: [facility] ;  Service: Orthopedics;  Laterality: Right;    left shoulder arthroscopy     MENISCUS REPAIR Right 03/2021   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 He is retired. He did concrete work for several years. He was in the [employer]	[place] for 4 years in the 1970s, but never went to [place]. They currently live in a house that is 69 years old. There is hardwood and carpeting in the main living areas and carpeting in the bedrooms. There is gas heating and central cooling. There are no dust mite coverings on the bedding. There is no fume, chemical, or dust exposure in the home. He was a smoker in the past.     Review of Systems  Constitutional: Negative.  Negative for chills, fever, malaise/fatigue and weight loss.  HENT: Negative.  Negative for congestion, ear discharge, ear pain and sinus pain.   Eyes:  Negative for pain, discharge and redness.  Respiratory:  Negative for cough, sputum production, shortness of breath and wheezing.   Cardiovascular: Negative.  Negative for chest pain and palpitations.  Gastrointestinal:  Negative for abdominal pain, constipation,  diarrhea, heartburn, nausea and vomiting.  Skin: Negative.  Negative for itching and rash.  Neurological:  Negative for dizziness and headaches.  Endo/Heme/Allergies:  Positive for environmental  allergies. Does not bruise/bleed easily.       Objective:   Blood pressure 132/82, pulse 76, temperature 98 F (36.7 C), temperature source Temporal, resp. rate 18, height 5\' 8"  (1.727 m), weight 214 lb 9.6 oz (97.3 kg), SpO2 97 %. Body mass index is 32.63 kg/m.     Physical Exam Vitals reviewed.  Constitutional:      Appearance: He is well-developed.  HENT:     Head: Normocephalic and atraumatic.     Right Ear: Tympanic membrane, ear canal and external ear normal. No drainage, swelling or tenderness. Tympanic membrane is not injected, scarred, erythematous, retracted or bulging.     Left Ear: Tympanic membrane, ear canal and external ear normal. No drainage, swelling or tenderness. Tympanic membrane is not injected, scarred, erythematous, retracted or bulging.     Nose: No nasal deformity, septal deviation, mucosal edema or rhinorrhea.     Right Turbinates: Enlarged, swollen and pale.     Left Turbinates: Enlarged, swollen and pale.     Right Sinus: No maxillary sinus tenderness or frontal sinus tenderness.     Left Sinus: No maxillary sinus tenderness or frontal sinus tenderness.     Mouth/Throat:     Mouth: Mucous membranes are not pale and not dry.     Pharynx: Uvula midline.  Eyes:     General:        Right eye: No discharge.        Left eye: No discharge.     Conjunctiva/sclera: Conjunctivae normal.     Right eye: Right conjunctiva is not injected. No chemosis.    Left eye: Left conjunctiva is not injected. No chemosis.    Pupils: Pupils are equal, round, and reactive to light.  Cardiovascular:     Rate and Rhythm: Normal rate and regular rhythm.     Heart sounds: Normal heart sounds.  Pulmonary:     Effort: Pulmonary effort is normal. No tachypnea, accessory muscle usage  or respiratory distress.     Breath sounds: Normal breath sounds. No wheezing, rhonchi or rales.  Chest:     Chest wall: No tenderness.  Abdominal:     Tenderness: There is no abdominal tenderness. There is no guarding or rebound.  Lymphadenopathy:     Head:     Right side of head: No submandibular, tonsillar or occipital adenopathy.     Left side of head: No submandibular, tonsillar or occipital adenopathy.     Cervical: No cervical adenopathy.  Skin:    General: Skin is warm.     Capillary Refill: Capillary refill takes less than 2 seconds.     Coloration: Skin is not pale.     Findings: No abrasion, erythema, petechiae or rash. Rash is not papular, urticarial or vesicular.     Comments: Very sensitive skin. No urticarial noted, but he is highly dermatographic.   Neurological:     Mental Status: He is alert.  Psychiatric:        Behavior: Behavior is cooperative.      Diagnostic studies:   Allergy Studies:     Airborne Adult Perc - 05/21/23 1029     Time Antigen Placed 1029    Allergen Manufacturer Waynette Buttery    Location Back    Number of Test 55    1. Control-Buffer 50% Glycerol Negative    2. Control-Histamine 2+    3. Bahia Negative    4. [place] 2+    5. Johnson 2+    6. [facility]  Blue 2+    7. Meadow Fescue 2+    8. Perennial Rye 2+    9. Timothy 3+    10. Ragweed Mix 3+    11. Cocklebur Negative    12. Plantain,  English 2+    13. Baccharis Negative    14. Dog Fennel Negative    15. Russian Thistle Negative    16. Lamb's Quarters Negative    17. Sheep Sorrell Negative    18. Rough Pigweed Negative    19. Marsh Elder, Rough Negative    20. Mugwort, Common Negative    21. Box, Elder Negative    22. Cedar, red Negative    23. Sweet Gum 2+    24. Pecan Pollen 2+    25. Pine Mix 2+    26. Walnut, Black Pollen 2+    27. Red Mulberry 2+    28. Ash Mix Negative    29. Birch Mix 3+    30. Beech American 2+    31. Cottonwood, Guinea[place] 2+    32. Hickory, White  2+    33. Maple Mix Negative    34. Oak, [place] Mix 3+    35. Sycamore Eastern 3+    36. Alternaria Alternata Negative    37. Cladosporium Herbarum Negative    38. Aspergillus Mix Negative    39. Penicillium Mix Negative    40. Bipolaris Sorokiniana (Helminthosporium) Negative    41. Drechslera Spicifera (Curvularia) Negative    42. Mucor Plumbeus Negative    43. Fusarium Moniliforme Negative    44. Aureobasidium Pullulans (pullulara) Negative    45. Rhizopus Oryzae Negative    46. Botrytis Cinera Negative    47. Epicoccum Nigrum Negative    48. Phoma Betae Negative    49. Dust Mite Mix Negative    50. Cat Hair 10,000 BAU/ml 2+    51.  Dog Epithelia 2+    52. Mixed Feathers --   +/-   53. Horse Epithelia Negative    54. Cockroach, German Negative    55. Tobacco Leaf Negative             Food Adult Perc - 05/21/23 1000     Time Antigen Placed 1029    Allergen Manufacturer Waynette Buttery    Location Back    Number of allergen test 17    Control-Histamine 2+    1. Peanut Negative    2. Soybean Negative    3. Wheat Negative    4. Sesame Negative    5. Milk, Cow Negative    6. Casein Negative    7. Egg White, Chicken --   +/-   8. Shellfish Mix Negative    9. Fish Mix Negative    10. Cashew Negative    11. Walnut Food Negative    12. Almond Negative    13. Hazelnut Negative    14. Pecan Food Negative    15. Pistachio Negative    16. [place] Nut Negative    17. Coconut Negative             Allergy testing results were read and interpreted by myself, documented by clinical staff.         Malachi Bonds, MD [facility]

## 2023-05-22 LAB — CMP14+EGFR
AST: 26 IU/L (ref 0–40)
Albumin/Globulin Ratio: 1.5 (ref 1.2–2.2)
Bilirubin Total: 0.4 mg/dL (ref 0.0–1.2)
Potassium: 5.1 mmol/L (ref 3.5–5.2)
Sodium: 139 mmol/L (ref 134–144)

## 2023-05-22 LAB — CBC WITH DIFFERENTIAL
Basophils Absolute: 0 10*3/uL (ref 0.0–0.2)
Hematocrit: 42.4 % (ref 37.5–51.0)
Hemoglobin: 14.2 g/dL (ref 13.0–17.7)
Lymphocytes Absolute: 1.3 10*3/uL (ref 0.7–3.1)
RDW: 13 % (ref 11.6–15.4)
WBC: 5.3 10*3/uL (ref 3.4–10.8)

## 2023-05-22 LAB — C3 AND C4: Complement C3, Serum: 144 mg/dL (ref 82–167)

## 2023-05-22 LAB — C-REACTIVE PROTEIN: CRP: 1 mg/L (ref 0–10)

## 2023-05-22 LAB — SEDIMENTATION RATE: Sed Rate: 8 mm/hr (ref 0–30)

## 2023-05-22 LAB — THYROID ANTIBODIES: Thyroperoxidase Ab SerPl-aCnc: 12 IU/mL (ref 0–34)

## 2023-05-22 LAB — CHRONIC URTICARIA

## 2023-05-23 LAB — CMP14+EGFR
Albumin: 4.2 g/dL (ref 3.9–4.9)
CO2: 27 mmol/L (ref 20–29)
Creatinine, Ser: 1.17 mg/dL (ref 0.76–1.27)
Total Protein: 7 g/dL (ref 6.0–8.5)

## 2023-05-23 LAB — CBC WITH DIFFERENTIAL
EOS (ABSOLUTE): 0.2 10*3/uL (ref 0.0–0.4)
Immature Granulocytes: 0 %
Lymphs: 25 %
MCHC: 33.5 g/dL (ref 31.5–35.7)

## 2023-05-24 LAB — ALPHA-GAL PANEL
Allergen Lamb IgE: 0.2 kU/L — AB
Beef IgE: <0.1 kU/L
IgE (Immunoglobulin E), Serum: 548 IU/mL — ABNORMAL HIGH (ref 6–495)
O215-IgE Alpha-Gal: <0.1 kU/L
Pork IgE: 0.37 kU/L — AB

## 2023-05-28 MED ORDER — LEVOCETIRIZINE DIHYDROCHLORIDE 5 MG PO TABS: 5.0000 mg | ORAL_TABLET | Freq: Two times a day (BID) | ORAL | 5 refills | Status: DC

## 2023-05-28 NOTE — Telephone Encounter (Signed)
Patient called back and he would like the xyzal sent in as twice a day for the hives.   [facility] [address]

## 2023-05-28 NOTE — Telephone Encounter (Signed)
SENt in xyzal to [facility] [address] for bid

## 2023-05-31 LAB — TRYPTASE: Tryptase: 8.3 ug/L (ref 2.2–13.2)

## 2023-05-31 LAB — CBC WITH DIFFERENTIAL
Basos: 0 %
Eos: 3 %
Immature Grans (Abs): 0 10*3/uL (ref 0.0–0.1)
MCH: 28.9 pg (ref 26.6–33.0)
MCV: 86 fL (ref 79–97)
Monocytes Absolute: 0.5 10*3/uL (ref 0.1–0.9)
Monocytes: 9 %
Neutrophils Absolute: 3.3 10*3/uL (ref 1.4–7.0)
Neutrophils: 63 %
RBC: 4.92 x10E6/uL (ref 4.14–5.80)

## 2023-05-31 LAB — CMP14+EGFR
ALT: 25 IU/L (ref 0–44)
Alkaline Phosphatase: 91 IU/L (ref 44–121)
BUN/Creatinine Ratio: 11 (ref 10–24)
BUN: 13 mg/dL (ref 8–27)
Calcium: 9.9 mg/dL (ref 8.6–10.2)
Chloride: 101 mmol/L (ref 96–106)
Globulin, Total: 2.8 g/dL (ref 1.5–4.5)
Glucose: 90 mg/dL (ref 70–99)
eGFR: 67 mL/min/{1.73_m2} (ref >59)

## 2023-05-31 LAB — C3 AND C4: Complement C4, Serum: 36 mg/dL (ref 12–38)

## 2023-05-31 LAB — THYROID ANTIBODIES: Thyroglobulin Antibody: <1 IU/mL (ref 0.0–0.9)

## 2023-07-18 ENCOUNTER — Encounter (INDEPENDENT_AMBULATORY_CARE_PROVIDER_SITE_OTHER): Payer: Self-pay

## 2023-07-23 ENCOUNTER — Encounter: Payer: Self-pay | Admitting: Internal Medicine

## 2023-07-23 ENCOUNTER — Ambulatory Visit (INDEPENDENT_AMBULATORY_CARE_PROVIDER_SITE_OTHER): Payer: Medicare Other | Admitting: Internal Medicine

## 2023-07-23 ENCOUNTER — Other Ambulatory Visit: Payer: Self-pay

## 2023-07-23 VITALS — BP 136/84 | HR 74 | Temp 98.3°F | Resp 18

## 2023-07-23 DIAGNOSIS — J302 Other seasonal allergic rhinitis: Secondary | ICD-10-CM | POA: Diagnosis not present

## 2023-07-23 DIAGNOSIS — L501 Idiopathic urticaria: Secondary | ICD-10-CM

## 2023-07-23 DIAGNOSIS — J3089 Other allergic rhinitis: Secondary | ICD-10-CM

## 2023-07-23 MED ORDER — FAMOTIDINE 20 MG PO TABS: 20.0000 mg | ORAL_TABLET | Freq: Two times a day (BID) | ORAL | 5 refills | Status: DC

## 2023-07-23 MED ORDER — LEVOCETIRIZINE DIHYDROCHLORIDE 5 MG PO TABS: 5.0000 mg | ORAL_TABLET | Freq: Two times a day (BID) | ORAL | 5 refills | Status: DC

## 2023-07-23 NOTE — Patient Instructions (Addendum)
1. Seasonal and perennial allergic rhinitis - SPT positive 05/2023: grasses, ragweed, weeds, trees, cat, dog, and mixed feathers - Avoidance measures discussed.   - Use Xyzal 5mg  daily or twice daily for runny nose, sneezing, itchy watery eyes.   - Consider nasal saline rinses 1-2 times daily to remove allergens from the nasal cavities as well as help with mucous clearance (this is especially helpful to do before the nasal sprays are given) - Consider allergy shots as a means of long-term control. Allergy shots "re-train" and "reset" the immune system to ignore environmental allergens and decrease the resulting immune response to those allergens (sneezing, itchy watery eyes, runny nose, nasal congestion, etc).  Allergy shots improve symptoms in 75-85% of patients.   2. Idiopathic Urticaria with Angioedema - At this time etiology of hives and swelling is unknown. Hives can be caused by a variety of different triggers including illness/infection, exercise, pressure, vibrations, extremes of temperature to name a few however majority of the time there is no identifiable trigger.  - Increase to Xyzal 5mg  twice daily. If notice drowsiness, switch to Allegra 180mg  twice daily.   - If still having hives or swelling, continue Xyzal 5mg  twice daily and add Famotidine (Pepcid) 20mg  twice daily.  - You can change this dosing at home, decreasing the dose as needed or increasing the dosing as needed.  - If symptoms uncontrolled, can consider Xolair injections  3. Return in about 3 months (around 10/23/2023).

## 2023-07-23 NOTE — Progress Notes (Signed)
FOLLOW UP Date of Service/Encounter:  07/23/23   Subjective:  Eric Kerr (DOB: 1951/01/25) is a 72 y.o. male who [facility] follow up for allergic rhinoconjunctivitis and urticaria/angioedema.   History obtained from: chart review and patient. Last visit was on 05/21/2023 with Dr.  Dellis Anes and was started on Xyzal/Pepcid with discussion of Xolair.   Rhinitis: Reports doing okay overall.  Taking Xyzal 5mg  daily.  Not much congestion, runny nose, sneezing, itchy watery eyes.  Hives/Swelling: No recurrence of hives. Still having intermittent lower lip swelling, lasting a few hours, resolves without anything.  No specific food he can pinpoint. He did not pick up an Epipen; never had laryngeal/severe swelling resulting in trouble breathing or swallowing.  Taking xyzal daily but did not use Pepcid and did not increase Xyzal dose.   Past Medical History: Past Medical History:  Diagnosis Date   Acute lateral meniscus tear of right knee    Allergy    Arthritis    Colon polyps    History of chickenpox as child   Hyperlipidemia    Hypothyroidism    Sleep apnea    could not tolerate cpap mild osa   Vertigo     Objective:  BP 136/84   Pulse 74   Temp 98.3 F (36.8 C)   Resp 18   SpO2 96%  There is no height or weight on file to calculate BMI. Physical Exam: GEN: alert, well developed HEENT: clear conjunctiva,  nose with mild inferior turbinate hypertrophy, pink nasal mucosa,no rhinorrhea, no cobblestoning HEART: regular rate and rhythm, no murmur LUNGS: clear to auscultation bilaterally, no coughing, unlabored respiration SKIN: no rashes or lesions   Assessment:   1. Idiopathic urticaria   2. Seasonal and perennial allergic rhinitis     Plan/Recommendations:   1. Seasonal and perennial allergic rhinitis - Controlled  - SPT positive 05/2023: grasses, ragweed, weeds, trees, cat, dog, and mixed feathers - Avoidance  measures discussed.   - Use Xyzal 5mg  daily or twice daily for runny nose, sneezing, itchy watery eyes.   - Consider nasal saline rinses 1-2 times daily to remove allergens from the nasal cavities as well as help with mucous clearance (this is especially helpful to do before the nasal sprays are given) - Consider allergy shots as a means of long-term control. Allergy shots "re-train" and "reset" the immune system to ignore environmental allergens and decrease the resulting immune response to those allergens (sneezing, itchy watery eyes, runny nose, nasal congestion, etc).  Allergy shots improve symptoms in 75-85% of patients.   2. Idiopathic Urticaria with Angioedema - Not controlled, discussed escalation of anti histamines as needed based on symptoms.  Keep diary of how often it is happening.  - At this time etiology of hives and swelling is unknown. Hives can be caused by a variety of different triggers including illness/infection, exercise, pressure, vibrations, extremes of temperature to name a few however majority of the time there is no identifiable trigger.  - 05/2023: CU index elevated  - Increase to Xyzal 5mg  twice daily. If notice drowsiness, switch to Allegra 180mg  twice daily.   - If still having hives or swelling, continue Xyzal 5mg  twice daily and add Famotidine (Pepcid) 20mg  twice daily.  - You can change this dosing at home, decreasing the dose as needed or increasing the dosing as needed.  - If symptoms uncontrolled, can consider Xolair injections  Return in about 3 months (around 10/23/2023).  Alesia Morin, MD [facility]

## 2023-08-09 ENCOUNTER — Encounter: Payer: Self-pay | Admitting: Family Medicine

## 2023-08-09 ENCOUNTER — Ambulatory Visit (INDEPENDENT_AMBULATORY_CARE_PROVIDER_SITE_OTHER): Payer: Medicare Other | Admitting: Family Medicine

## 2023-08-09 ENCOUNTER — Ambulatory Visit: Payer: Medicare Other | Admitting: Family Medicine

## 2023-08-09 VITALS — BP 120/70 | HR 79 | Temp 98.6°F | Ht 68.0 in | Wt 212.2 lb

## 2023-08-09 DIAGNOSIS — Z6832 Body mass index (BMI) 32.0-32.9, adult: Secondary | ICD-10-CM

## 2023-08-09 DIAGNOSIS — E7800 Pure hypercholesterolemia, unspecified: Secondary | ICD-10-CM

## 2023-08-09 DIAGNOSIS — E039 Hypothyroidism, unspecified: Secondary | ICD-10-CM | POA: Diagnosis not present

## 2023-08-09 DIAGNOSIS — R5383 Other fatigue: Secondary | ICD-10-CM | POA: Diagnosis not present

## 2023-08-09 NOTE — Assessment & Plan Note (Signed)
Stable. Continue with Simvastatin 20mg  daily. Ordered lipid panel. Patient is not fasting and will make an appointment when fasting to obtain all his labs.

## 2023-08-09 NOTE — Patient Instructions (Addendum)
-  Continue all prescribed medications.  -Ordered labs. Please make an appointment and be fasting. Office will call with lab results and you may see them on MyChart. -If labs are stable with no reason for fatigue, will recommend a sleep study. -Follow up in 6 months for chronic management.  -Please obtain Tdap at your local pharmacy.

## 2023-08-09 NOTE — Assessment & Plan Note (Signed)
Continue Levothyroxine daily. Patient is complaining of fatigue. Will order TSH, Vitamin D, Vitamin B12, CBC, and Iron study to see if his fatigue is related to hypothyroidism or to another source. Will adjust medication if lab is abnormal.

## 2023-08-09 NOTE — Progress Notes (Signed)
Established Patient Office Visit   Subjective:  Patient ID: Eric Kerr, male    DOB: Dec 18, 1951  Age: 72 y.o. MRN: [id number]  Chief Complaint  Patient presents with   Follow-up    Pt reports he is fatigued a lot. Pt reports stopped IRON SUPPLEMENT. He reports 6-7hrs sleep nightly - 2-3x getting up with urination due to excessive water and juice in take. Pt reports shoulders pain in both shoulders, when sleeping at night.     HPI Hypothyroidism: Chronic. Patient is taking Levothyroxine daily. Patient is reporting fatigue. Usually first thing in the morning and usually late mid morning. He reports weeks ago he started having fatigue. He reports he usually get 6-7 hours sleep at night. He reports wakes up at night if he needs to urinate. He notices if he drinks a lot of fluid before bedtime, such water or juice, he will get up more during the time. He reports he will go right back to sleep. Still tired during the morning if he does not get up during the night. Denies any other hypothyroid symptoms. He reports he has previously been told he had sleep apnea and was suppose to use a Cpap.  He reports he stopped taking his iron supplement because he read somewhere about iron supplement relating to getting older.   Hyperlipidemia: Chronic. Patient is taking Simvastatin 20mg  daily. He reports he has bilateral shoulder pain from sleeping on them. Denies any muscle or joint pain. Denies abd pain, nausea, or vomiting.   ROS See HPI above     Objective:   BP 120/70   Pulse 79   Temp 98.6 F (37 C)   Ht 5\' 8"  (1.727 m)   Wt 212 lb 4 oz (96.3 kg)   SpO2 94%   BMI 32.27 kg/m    Physical Exam Vitals reviewed.  Constitutional:      General: He is not in acute distress.    Appearance: Normal appearance. He is not ill-appearing, toxic-appearing or diaphoretic.  HENT:     Head: Normocephalic and atraumatic.  Eyes:     General:        Right eye: No discharge.        Left eye: No  discharge.     Conjunctiva/sclera: Conjunctivae normal.  Cardiovascular:     Rate and Rhythm: Normal rate and regular rhythm.     Heart sounds: Normal heart sounds. No murmur heard.    No friction rub. No gallop.  Pulmonary:     Effort: Pulmonary effort is normal. No respiratory distress.     Breath sounds: Normal breath sounds.  Musculoskeletal:        General: Normal range of motion.     Right lower leg: No edema.     Left lower leg: No edema.  Skin:    General: Skin is warm and dry.  Neurological:     General: No focal deficit present.     Mental Status: He is alert and oriented to person, place, and time. Mental status is at baseline.  Psychiatric:        Mood and Affect: Mood normal.        Behavior: Behavior normal.        Thought Content: Thought content normal.        Judgment: Judgment normal.      Assessment & Plan:  Acquired hypothyroidism Assessment & Plan: Continue Levothyroxine daily. Patient is complaining of fatigue. Will order TSH, Vitamin D, Vitamin B12, CBC,  and Iron study to see if his fatigue is related to hypothyroidism or to another source. Will adjust medication if lab is abnormal.   Orders: -     TSH; Future  Pure hypercholesterolemia Assessment & Plan: Stable. Continue with Simvastatin 20mg  daily. Ordered lipid panel. Patient is not fasting and will make an appointment when fasting to obtain all his labs.   Orders: -     Lipid panel; Future  Fatigue, unspecified type -     CBC with Differential/Platelet; Future -     Iron, TIBC and Ferritin Panel; Future -     Vitamin B12; Future -     VITAMIN D 25 Hydroxy (Vit-D Deficiency, Fractures); Future  Body mass index (BMI) 32.0-32.9, adult -     VITAMIN D 25 Hydroxy (Vit-D Deficiency, Fractures); Future  1.Review health maintenance:  -Covid booster: declines -Influenza vaccine: declines for the season  -Tdap-obtain at local pharmacy.  2.If labs are stable with no reason for fatigue, will  recommend a sleep study. Return in about 6 months (around 02/09/2024) for chronic management: labs appointment when fasting.   Zandra Abts, NP

## 2023-08-11 ENCOUNTER — Other Ambulatory Visit: Payer: Self-pay | Admitting: Family Medicine

## 2023-08-11 DIAGNOSIS — E7800 Pure hypercholesterolemia, unspecified: Secondary | ICD-10-CM

## 2023-08-13 ENCOUNTER — Telehealth: Payer: Self-pay

## 2023-08-13 ENCOUNTER — Other Ambulatory Visit: Payer: Medicare Other

## 2023-08-13 DIAGNOSIS — R5383 Other fatigue: Secondary | ICD-10-CM

## 2023-08-13 DIAGNOSIS — E7800 Pure hypercholesterolemia, unspecified: Secondary | ICD-10-CM

## 2023-08-13 DIAGNOSIS — E039 Hypothyroidism, unspecified: Secondary | ICD-10-CM

## 2023-08-13 DIAGNOSIS — Z6832 Body mass index (BMI) 32.0-32.9, adult: Secondary | ICD-10-CM

## 2023-08-13 LAB — LIPID PANEL
Cholesterol: 156 mg/dL (ref 0–200)
HDL: 44.6 mg/dL (ref >39.00)
LDL Cholesterol: 100 mg/dL — ABNORMAL HIGH (ref 0–99)
NonHDL: 111.8
Total CHOL/HDL Ratio: 4
Triglycerides: 61 mg/dL (ref 0.0–149.0)
VLDL: 12.2 mg/dL (ref 0.0–40.0)

## 2023-08-13 LAB — CBC WITH DIFFERENTIAL/PLATELET
Basophils Absolute: 0 10*3/uL (ref 0.0–0.1)
Basophils Relative: 0.1 % (ref 0.0–3.0)
Eosinophils Absolute: 0.2 10*3/uL (ref 0.0–0.7)
Eosinophils Relative: 3.9 % (ref 0.0–5.0)
HCT: 42.6 % (ref 39.0–52.0)
Hemoglobin: 14 g/dL (ref 13.0–17.0)
Lymphocytes Relative: 23.6 % (ref 12.0–46.0)
Lymphs Abs: 1.2 10*3/uL (ref 0.7–4.0)
MCHC: 32.9 g/dL (ref 30.0–36.0)
MCV: 84.9 fl (ref 78.0–100.0)
Monocytes Absolute: 0.4 10*3/uL (ref 0.1–1.0)
Monocytes Relative: 8.6 % (ref 3.0–12.0)
Neutro Abs: 3.3 10*3/uL (ref 1.4–7.7)
Neutrophils Relative %: 63.8 % (ref 43.0–77.0)
Platelets: 224 10*3/uL (ref 150.0–400.0)
RBC: 5.01 Mil/uL (ref 4.22–5.81)
RDW: 14.2 % (ref 11.5–15.5)
WBC: 5.1 10*3/uL (ref 4.0–10.5)

## 2023-08-13 LAB — VITAMIN D 25 HYDROXY (VIT D DEFICIENCY, FRACTURES): VITD: 60.58 ng/mL (ref 30.00–100.00)

## 2023-08-13 LAB — VITAMIN B12: Vitamin B-12: 684 pg/mL (ref 211–911)

## 2023-08-13 LAB — TSH: TSH: 3.32 u[IU]/mL (ref 0.35–5.50)

## 2023-08-13 NOTE — Telephone Encounter (Signed)
-----   Message from Zandra Abts sent at 08/13/2023  3:30 PM EDT ----- Your lipids are looking good, improvement mostly from last draw. Continue to take cholesterol medication, healthy diet of decreasing any greasy, fatty foods, and regular exercise. All labs are stable. No findings from labs to suggest your fatigue symptoms. Recommend a sleep study for fatigue.

## 2023-08-14 ENCOUNTER — Telehealth: Payer: Self-pay

## 2023-08-14 LAB — IRON,TIBC AND FERRITIN PANEL
%SAT: 32 % (ref 20–48)
Ferritin: 117 ng/mL (ref 24–380)
Iron: 90 ug/dL (ref 50–180)
TIBC: 283 ug/dL (ref 250–425)

## 2023-08-14 NOTE — Telephone Encounter (Signed)
-----   Message from Zandra Abts sent at 08/14/2023  8:00 AM EDT ----- Iron panel is stable. No concerns.

## 2023-09-16 ENCOUNTER — Other Ambulatory Visit: Payer: Self-pay | Admitting: Family Medicine

## 2023-09-16 DIAGNOSIS — E039 Hypothyroidism, unspecified: Secondary | ICD-10-CM

## 2023-10-29 ENCOUNTER — Ambulatory Visit: Payer: Medicare Other | Admitting: Internal Medicine

## 2023-12-13 ENCOUNTER — Other Ambulatory Visit: Payer: Self-pay | Admitting: Family Medicine

## 2023-12-13 DIAGNOSIS — E7800 Pure hypercholesterolemia, unspecified: Secondary | ICD-10-CM

## 2023-12-21 ENCOUNTER — Telehealth: Payer: Self-pay | Admitting: *Deleted

## 2023-12-21 ENCOUNTER — Other Ambulatory Visit: Payer: Self-pay | Admitting: Family

## 2023-12-21 DIAGNOSIS — E039 Hypothyroidism, unspecified: Secondary | ICD-10-CM

## 2023-12-21 MED ORDER — LEVOTHYROXINE SODIUM 75 MCG PO TABS: 75.0000 ug | ORAL_TABLET | Freq: Every day | ORAL | 0 refills | Status: DC

## 2023-12-21 NOTE — Telephone Encounter (Signed)
 Copied from CRM [id number]. Topic: Clinical - Medication Refill >> Dec 21, 2023  1:55 PM Burnard DEL wrote: Most Recent Primary Care Visit:  Provider: [facility]  Department: [facility]  Visit Type: LAB  Date: 08/13/2023  Medication: levothyroxine  (SYNTHROID ) 75 MCG tablet  Has the patient contacted their pharmacy? Yes (Agent: If no, request that the patient contact the pharmacy for the refill. If patient does not wish to contact the pharmacy document the reason why and proceed with request.) (Agent: If yes, when and what did the pharmacy advise?)  Is this the correct pharmacy for this prescription? Yes If no, delete pharmacy and type the correct one.  This is the patient's preferred pharmacy:  [facility] [address], [place] - [address] [place] [address] Phone: [phone] Fax: [phone]   Has the prescription been filled recently? No  Is the patient out of the medication? No  Has the patient been seen for an appointment in the last year OR does the patient have an upcoming appointment? Yes  Can we respond through MyChart? Yes  Agent: Please be advised that Rx refills may take up to 3 business days. We ask that you follow-up with your pharmacy.

## 2024-01-15 ENCOUNTER — Other Ambulatory Visit: Payer: Self-pay | Admitting: Family

## 2024-01-15 DIAGNOSIS — E7800 Pure hypercholesterolemia, unspecified: Secondary | ICD-10-CM

## 2024-01-23 ENCOUNTER — Encounter: Payer: Self-pay | Admitting: Family Medicine

## 2024-01-23 ENCOUNTER — Ambulatory Visit: Payer: Medicare Other | Admitting: Family Medicine

## 2024-01-23 VITALS — BP 138/82 | HR 70 | Ht 67.0 in | Wt 232.2 lb

## 2024-01-23 DIAGNOSIS — E7800 Pure hypercholesterolemia, unspecified: Secondary | ICD-10-CM | POA: Diagnosis not present

## 2024-01-23 DIAGNOSIS — E039 Hypothyroidism, unspecified: Secondary | ICD-10-CM

## 2024-01-23 DIAGNOSIS — J329 Chronic sinusitis, unspecified: Secondary | ICD-10-CM | POA: Diagnosis not present

## 2024-01-23 LAB — COMPREHENSIVE METABOLIC PANEL
ALT: 25 U/L (ref 0–53)
AST: 23 U/L (ref 0–37)
Albumin: 4 g/dL (ref 3.5–5.2)
Alkaline Phosphatase: 71 U/L (ref 39–117)
BUN: 13 mg/dL (ref 6–23)
CO2: 30 meq/L (ref 19–32)
Calcium: 9 mg/dL (ref 8.4–10.5)
Chloride: 102 meq/L (ref 96–112)
Creatinine, Ser: 1.07 mg/dL (ref 0.40–1.50)
GFR: 70.06 mL/min (ref >60.00)
Glucose, Bld: 101 mg/dL — ABNORMAL HIGH (ref 70–99)
Potassium: 4.6 meq/L (ref 3.5–5.1)
Sodium: 138 meq/L (ref 135–145)
Total Bilirubin: 0.5 mg/dL (ref 0.2–1.2)
Total Protein: 7.2 g/dL (ref 6.0–8.3)

## 2024-01-23 LAB — LIPID PANEL
Cholesterol: 178 mg/dL (ref 0–200)
HDL: 57.7 mg/dL (ref >39.00)
LDL Cholesterol: 105 mg/dL — ABNORMAL HIGH (ref 0–99)
NonHDL: 120.35
Total CHOL/HDL Ratio: 3
Triglycerides: 76 mg/dL (ref 0.0–149.0)
VLDL: 15.2 mg/dL (ref 0.0–40.0)

## 2024-01-23 LAB — TSH: TSH: 4.29 u[IU]/mL (ref 0.35–5.50)

## 2024-01-23 MED ORDER — SIMVASTATIN 20 MG PO TABS: 20.0000 mg | ORAL_TABLET | Freq: Every day | ORAL | 3 refills | Status: AC

## 2024-01-23 MED ORDER — AZITHROMYCIN 250 MG PO TABS: ORAL_TABLET | ORAL | 0 refills | Status: AC

## 2024-01-23 NOTE — Assessment & Plan Note (Signed)
 Stable. Continue with Simvastatin  20mg  daily. Refilled medication. Ordered lipid panel. Patient is fasting.

## 2024-01-23 NOTE — Assessment & Plan Note (Signed)
 Continue Levothyroxine  75mcg daily. Patient is complaining of chronic fatigue. Previously worked up fatigue with labs that were stable. Recommended a sleep study as a next step of reason for fatigue. However, declines a referral. Will adjust medication if lab is abnormal.

## 2024-01-23 NOTE — Patient Instructions (Addendum)
-  Prescribed Azithromycin  250mg  tablet, take 2 tablets today and 1 tablet second through the 5 day. If symptoms not improved, please call the office or send a MyChart message. Then, try Prednisone.  -Continue Levothyroxine  and Simvastatin . Refilled Simvastatin . -Follow up in 6 months.

## 2024-01-23 NOTE — Progress Notes (Signed)
 Established Patient Office Visit   Subjective:  Patient ID: Eric Kerr, male    DOB: August 25, 1951  Age: 73 y.o. MRN: [id number]  Chief Complaint  Patient presents with   Follow-up    6 month f/u   sinus issues    Nasal congestion, stuffy head and runny nose for 1 month     HPI Hypothyroidism: Chronic. Patient is taking Levothyroxine  75mcg daily. Patient is reporting he is always tired, but this has been going for a while. Denies any other symptoms of hypothyroidism.  Lab Results  Component Value Date   TSH 3.32 08/13/2023    Hyperlipidemia: Chronic. Patient is taking Simvastatin  20mg  daily.  Denies any muscle or joint pain. Denies abd pain, nausea, or vomiting.  Lab Results  Component Value Date   CHOL 156 08/13/2023   HDL 44.60 08/13/2023   LDLCALC 100 (H) 08/13/2023   TRIG 61.0 08/13/2023   CHOLHDL 4 08/13/2023    Patient is complaining of nasal and sinus congestion and headache for the last month. He reports he usually has a headache most nights. He reports once he starts moving around in the morning, his symptoms will resolve. He reports he has tried saline solution nasal spray and lying on an elevated mattress with some relief. He reports taking Ibuprofen for headache sometimes. Denies blurry vision, dizziness, sore throat, ear pain or discharge, chest pain or shortness of breath.  ROS See HPI above     Objective:   BP 138/82 (BP Location: Left Arm, Patient Position: Sitting)   Pulse 70   Ht 5' 7 (1.702 m)   Wt 232 lb 3.2 oz (105.3 kg)   SpO2 94%   BMI 36.37 kg/m    Physical Exam Vitals reviewed.  Constitutional:      General: He is not in acute distress.    Appearance: Normal appearance. He is obese. He is not ill-appearing or toxic-appearing.  HENT:     Head: Normocephalic and atraumatic.     Right Ear: Tympanic membrane and external ear normal. There is no impacted cerumen.     Left Ear: Tympanic membrane, ear canal and external ear normal. There is no  impacted cerumen.     Ears:     Comments: Mild cerumen in right ear canal     Nose:     Right Sinus: No maxillary sinus tenderness or frontal sinus tenderness.     Left Sinus: No maxillary sinus tenderness or frontal sinus tenderness.     Mouth/Throat:     Mouth: Mucous membranes are moist.     Pharynx: Oropharynx is clear. Uvula midline. No pharyngeal swelling, oropharyngeal exudate, posterior oropharyngeal erythema or uvula swelling.  Eyes:     General:        Right eye: No discharge.        Left eye: No discharge.     Conjunctiva/sclera: Conjunctivae normal.     Pupils: Pupils are equal, round, and reactive to light.  Neck:     Thyroid : No thyromegaly.  Cardiovascular:     Rate and Rhythm: Normal rate and regular rhythm.     Heart sounds: Normal heart sounds. No murmur heard.    No friction rub. No gallop.  Pulmonary:     Effort: Pulmonary effort is normal. No respiratory distress.     Breath sounds: Normal breath sounds.  Abdominal:     General: Abdomen is flat. Bowel sounds are normal.     Palpations: Abdomen is soft.  Musculoskeletal:  General: Normal range of motion.     Cervical back: Normal range of motion.  Lymphadenopathy:     Cervical: No cervical adenopathy.  Skin:    General: Skin is warm and dry.  Neurological:     General: No focal deficit present.     Mental Status: He is alert and oriented to person, place, and time. Mental status is at baseline.  Psychiatric:        Mood and Affect: Mood normal.        Behavior: Behavior normal.        Thought Content: Thought content normal.        Judgment: Judgment normal.    The 10-year ASCVD risk score (Arnett DK, et al., 2019) is: 20.4%    Assessment & Plan:  Acquired hypothyroidism Assessment & Plan: Continue Levothyroxine  75mcg daily. Patient is complaining of chronic fatigue. Previously worked up fatigue with labs that were stable. Recommended a sleep study as a next step of reason for fatigue.  However, declines a referral. Will adjust medication if lab is abnormal.   Orders: -     TSH  Pure hypercholesterolemia Assessment & Plan: Stable. Continue with Simvastatin  20mg  daily. Refilled medication. Ordered lipid panel. Patient is fasting.    Orders: -     Simvastatin ; Take 1 tablet (20 mg total) by mouth daily.  Dispense: 90 tablet; Refill: 3 -     Lipid panel -     Comprehensive metabolic panel  Sinusitis, unspecified chronicity, unspecified location -     Azithromycin ; Take 2 tablets on day 1, then 1 tablet daily on days 2 through 5  Dispense: 6 tablet; Refill: 0   1.Review health maintenance:  -Covid vaccine: Declines -Influenza vaccine: Declines  2.-Prescribed Azithromycin  250mg  tablet, take 2 tablets today and 1 tablet second through the 5 day. Advised if symptoms not improved, please call the office or send a MyChart message. Then, try Prednisone.  Return in about 6 months (around 07/22/2024) for chronic management.   Lounette Sloan, NP

## 2024-02-19 ENCOUNTER — Ambulatory Visit: Payer: Medicare Other | Admitting: Family Medicine

## 2024-03-09 ENCOUNTER — Other Ambulatory Visit: Payer: Self-pay | Admitting: Family

## 2024-03-09 DIAGNOSIS — E039 Hypothyroidism, unspecified: Secondary | ICD-10-CM

## 2024-03-20 ENCOUNTER — Other Ambulatory Visit: Payer: Self-pay | Admitting: Family Medicine

## 2024-03-20 DIAGNOSIS — E039 Hypothyroidism, unspecified: Secondary | ICD-10-CM

## 2024-06-10 ENCOUNTER — Other Ambulatory Visit: Payer: Self-pay | Admitting: Family Medicine

## 2024-06-10 DIAGNOSIS — E039 Hypothyroidism, unspecified: Secondary | ICD-10-CM

## 2024-07-22 ENCOUNTER — Ambulatory Visit (INDEPENDENT_AMBULATORY_CARE_PROVIDER_SITE_OTHER): Payer: Medicare Other | Admitting: Family Medicine

## 2024-07-22 ENCOUNTER — Encounter: Payer: Self-pay | Admitting: Family Medicine

## 2024-07-22 VITALS — BP 134/88 | HR 65 | Temp 97.9°F | Ht 67.0 in | Wt 228.0 lb

## 2024-07-22 DIAGNOSIS — E039 Hypothyroidism, unspecified: Secondary | ICD-10-CM

## 2024-07-22 DIAGNOSIS — K222 Esophageal obstruction: Secondary | ICD-10-CM

## 2024-07-22 DIAGNOSIS — E7800 Pure hypercholesterolemia, unspecified: Secondary | ICD-10-CM

## 2024-07-22 DIAGNOSIS — K219 Gastro-esophageal reflux disease without esophagitis: Secondary | ICD-10-CM | POA: Diagnosis not present

## 2024-07-22 DIAGNOSIS — N400 Enlarged prostate without lower urinary tract symptoms: Secondary | ICD-10-CM

## 2024-07-22 LAB — CBC WITH DIFFERENTIAL/PLATELET
Basophils Absolute: 0.1 K/uL (ref 0.0–0.1)
Basophils Relative: 0.9 % (ref 0.0–3.0)
Eosinophils Absolute: 0.3 K/uL (ref 0.0–0.7)
Eosinophils Relative: 3.8 % (ref 0.0–5.0)
HCT: 44.3 % (ref 39.0–52.0)
Hemoglobin: 14.6 g/dL (ref 13.0–17.0)
Lymphocytes Relative: 18.5 % (ref 12.0–46.0)
Lymphs Abs: 1.4 K/uL (ref 0.7–4.0)
MCHC: 33 g/dL (ref 30.0–36.0)
MCV: 85.4 fl (ref 78.0–100.0)
Monocytes Absolute: 0.6 K/uL (ref 0.1–1.0)
Monocytes Relative: 7.7 % (ref 3.0–12.0)
Neutro Abs: 5.2 K/uL (ref 1.4–7.7)
Neutrophils Relative %: 69.1 % (ref 43.0–77.0)
Platelets: 226 K/uL (ref 150.0–400.0)
RBC: 5.18 Mil/uL (ref 4.22–5.81)
RDW: 14 % (ref 11.5–15.5)
WBC: 7.6 K/uL (ref 4.0–10.5)

## 2024-07-22 LAB — COMPREHENSIVE METABOLIC PANEL WITH GFR
ALT: 28 U/L (ref 0–53)
AST: 20 U/L (ref 0–37)
Albumin: 4.1 g/dL (ref 3.5–5.2)
Alkaline Phosphatase: 77 U/L (ref 39–117)
BUN: 15 mg/dL (ref 6–23)
CO2: 33 meq/L — ABNORMAL HIGH (ref 19–32)
Calcium: 9.7 mg/dL (ref 8.4–10.5)
Chloride: 99 meq/L (ref 96–112)
Creatinine, Ser: 1.13 mg/dL (ref 0.40–1.50)
GFR: 65.39 mL/min (ref >60.00)
Glucose, Bld: 86 mg/dL (ref 70–99)
Potassium: 4.9 meq/L (ref 3.5–5.1)
Sodium: 138 meq/L (ref 135–145)
Total Bilirubin: 0.5 mg/dL (ref 0.2–1.2)
Total Protein: 6.9 g/dL (ref 6.0–8.3)

## 2024-07-22 LAB — LIPID PANEL
Cholesterol: 167 mg/dL (ref 0–200)
HDL: 55.3 mg/dL (ref >39.00)
LDL Cholesterol: 98 mg/dL (ref 0–99)
NonHDL: 111.89
Total CHOL/HDL Ratio: 3
Triglycerides: 67 mg/dL (ref 0.0–149.0)
VLDL: 13.4 mg/dL (ref 0.0–40.0)

## 2024-07-22 LAB — TSH: TSH: 6.6 u[IU]/mL — ABNORMAL HIGH (ref 0.35–5.50)

## 2024-07-22 LAB — PSA: PSA: 2.78 ng/mL (ref 0.10–4.00)

## 2024-07-22 NOTE — Assessment & Plan Note (Signed)
 Continue Levothyroxine  75mcg daily. Ordered TSH.

## 2024-07-22 NOTE — Progress Notes (Signed)
 Established Patient Office Visit   Subjective:  Patient ID: Eric Kerr, male    DOB: 07/21/1951  Age: 73 y.o. MRN: [id number]  Chief Complaint  Patient presents with   Medical Management of Chronic Issues    6 month follow up     HPI Hypothyroidism: Chronic. Patient is taking Levothyroxine  75mcg daily. Patient is reporting he is always tired, but this has been going for a while. In the past, checked labs for a reason and offered a referral for sleep apnea test, declined previously.  Denies any other symptoms of hypothyroidism.  Lab Results  Component Value Date   TSH 4.29 01/23/2024    Hyperlipidemia: Chronic. Patient is taking Simvastatin  20mg  daily and Omega 3 Fatty Acid.  Denies any muscle or joint pain. Denies abd pain, nausea, or vomiting. At last lab result, recommended to increase Simvastatin  to 40mg .  Lab Results  Component Value Date   CHOL 178 01/23/2024   HDL 57.70 01/23/2024   LDLCALC 105 (H) 01/23/2024   TRIG 76.0 01/23/2024   CHOLHDL 3 01/23/2024     He reports about 4-5 days ago, he feels like it is hard to pass food and liquid down his esophagus. Never had this happen before. He reports he has this discomfort for a few seconds. Denies choking. He reports he wakes up about 2-3 times a month during the night with gastric reflux. He will take Tums and it will relieve.  ROS See HPI above     Objective:   BP 134/88   Pulse 65   Temp 97.9 F (36.6 C) (Oral)   Ht 5' 7 (1.702 m)   Wt 228 lb (103.4 kg)   SpO2 98%   BMI 35.71 kg/m    Physical Exam Vitals reviewed.  Constitutional:      General: He is not in acute distress.    Appearance: Normal appearance. He is not ill-appearing, toxic-appearing or diaphoretic.  Eyes:     General:        Right eye: No discharge.        Left eye: No discharge.     Conjunctiva/sclera: Conjunctivae normal.  Neck:     Thyroid : No thyromegaly.  Cardiovascular:     Rate and Rhythm: Normal rate and regular rhythm.      Heart sounds: Normal heart sounds. No murmur heard.    No friction rub. No gallop.  Pulmonary:     Effort: Pulmonary effort is normal. No respiratory distress.     Breath sounds: Normal breath sounds.  Abdominal:     General: Bowel sounds are normal. There is no distension.     Palpations: There is no mass.     Tenderness: There is no abdominal tenderness.  Musculoskeletal:        General: Normal range of motion.  Skin:    General: Skin is warm and dry.  Neurological:     General: No focal deficit present.     Mental Status: He is alert and oriented to person, place, and time. Mental status is at baseline.  Psychiatric:        Mood and Affect: Mood normal.        Behavior: Behavior normal.        Thought Content: Thought content normal.        Judgment: Judgment normal.    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 Ordered TSH.   Orders: -     TSH  Pure hypercholesterolemia Assessment & Plan: Continue with Simvastatin  20mg  daily. Based on ASCVD score, asked for patient to increase to 40mg  daily, but didn't increase dose. Ordered lipid panel and CMP. Patient is fasting.     Orders: -     Comprehensive metabolic panel with GFR -     Lipid panel  Benign prostatic hyperplasia, unspecified whether lower urinary tract symptoms present -     PSA   1.Review health maintenance:  -Covid booster: Declines  -Influenza vaccine: Declines -AWV: Needs visit  2.Ordered PSA and CBC for prostate and GERD symptoms.  3.Placed an urgent referral to GI for GERD symptoms with possible  stricture.  4.Advise if food or liquid feels like it is stuck, recommend to go directly to the closes emergency  department.  Return in about 6 months (around 01/22/2025) for chronic management; AWV with [facility], LPN .   Kayla Weekes, NP

## 2024-07-22 NOTE — Assessment & Plan Note (Signed)
 Continue with Simvastatin  20mg  daily. Based on ASCVD score, asked for patient to increase to 40mg  daily, but didn't increase dose. Ordered lipid panel and CMP. Patient is fasting.

## 2024-07-22 NOTE — Patient Instructions (Addendum)
-  It was great to see you today.  -Ordered labs. Office will call with lab results and will be available through MyChart.  -Continue all medications.  -Placed an urgent referral to GI for GERD symptoms with possible  stricture. Please call the office or send a MyChart message if you do not receive a phone call or a MyChart message about appointment in 1 week.  -If food or liquid feels like it is stuck, recommend to go directly to the closes emergency department.

## 2024-07-23 ENCOUNTER — Ambulatory Visit: Payer: Self-pay | Admitting: Family Medicine

## 2024-07-23 DIAGNOSIS — E039 Hypothyroidism, unspecified: Secondary | ICD-10-CM

## 2024-07-30 ENCOUNTER — Encounter: Payer: Self-pay | Admitting: Physician Assistant

## 2024-09-04 ENCOUNTER — Ambulatory Visit: Admitting: Family Medicine

## 2024-09-04 ENCOUNTER — Encounter: Payer: Self-pay | Admitting: Family Medicine

## 2024-09-04 VITALS — BP 130/82 | HR 67 | Temp 97.7°F | Ht 67.0 in | Wt 227.0 lb

## 2024-09-04 DIAGNOSIS — K219 Gastro-esophageal reflux disease without esophagitis: Secondary | ICD-10-CM

## 2024-09-04 DIAGNOSIS — K222 Esophageal obstruction: Secondary | ICD-10-CM

## 2024-09-04 DIAGNOSIS — E039 Hypothyroidism, unspecified: Secondary | ICD-10-CM

## 2024-09-04 LAB — TSH: TSH: 2.52 u[IU]/mL (ref 0.35–5.50)

## 2024-09-04 NOTE — Progress Notes (Signed)
   Established Patient Office Visit   Subjective:  Patient ID: Eric Kerr, male    DOB: 03/29/1951  Age: 73 y.o. MRN: [id number]  Chief Complaint  Patient presents with   Medical Management of Chronic Issues    6 week follow up     HPI Hypothyroidism: Patients last TSH was 6.60, his Levothyroxine  dose was changed from 75mcg to 88mcg daily. He reports he has been taking his spouses dose since she had some. He reports he received a letter that [facility] was not covering Euthrox. Usually Levothyroxine  is sent into pharmacy.   Patient was referred to GI at last appointment for feeling like it was hard to pass food and liquid down his esophagus. He reports he has not had any more symptoms since last visit. He reports he will probably cancel GI appointment for 09/30/2024.   ROS See HPI above     Objective:   BP 130/82   Pulse 67   Temp 97.7 F (36.5 C) (Oral)   Ht 5' 7 (1.702 m)   Wt 227 lb (103 kg)   SpO2 98%   BMI 35.55 kg/m    Physical Exam Vitals reviewed.  Constitutional:      General: He is not in acute distress.    Appearance: Normal appearance. He is obese. He is not ill-appearing, toxic-appearing or diaphoretic.  Eyes:     General:        Right eye: No discharge.        Left eye: No discharge.     Conjunctiva/sclera: Conjunctivae normal.  Cardiovascular:     Rate and Rhythm: Normal rate.  Pulmonary:     Effort: Pulmonary effort is normal. No respiratory distress.  Musculoskeletal:        General: Normal range of motion.  Skin:    General: Skin is warm and dry.  Neurological:     General: No focal deficit present.     Mental Status: He is alert and oriented to person, place, and time. Mental status is at baseline.  Psychiatric:        Mood and Affect: Mood normal.        Behavior: Behavior normal.        Thought Content: Thought content normal.        Judgment: Judgment normal.      Assessment & Plan:  Hypothyroidism, unspecified type -     TSH  GERD  with stricture without esophagitis  -TSH had been ordered previously, just needs to be drawn. Will adjust medication if needed based on TSH level.  -Discussed about not having GI symptoms any longer.  -Next appointment is scheduled 01/27/2024.   Dearl Rudden, NP

## 2024-09-04 NOTE — Progress Notes (Signed)
 SABRA

## 2024-09-04 NOTE — Patient Instructions (Addendum)
-  It was nice to see you today. -TSH had been ordered previously, just needs to be drawn. Will adjust medication if needed based on TSH level.  -Discussed about not having GI symptoms any longer.  -Next appointment is scheduled 01/27/2024.

## 2024-09-05 ENCOUNTER — Ambulatory Visit: Payer: Self-pay | Admitting: Family Medicine

## 2024-09-05 DIAGNOSIS — E039 Hypothyroidism, unspecified: Secondary | ICD-10-CM

## 2024-09-08 ENCOUNTER — Telehealth: Payer: Self-pay | Admitting: *Deleted

## 2024-09-08 MED ORDER — LEVOTHYROXINE SODIUM 88 MCG PO TABS: 88.0000 ug | ORAL_TABLET | Freq: Every day | ORAL | 3 refills | Status: AC

## 2024-09-08 NOTE — Telephone Encounter (Signed)
 Sent message to patient earlier and medication has been sent in.

## 2024-09-08 NOTE — Telephone Encounter (Signed)
 Copied from CRM #[id number]. Topic: Clinical - Medication Question >> Sep 08, 2024 10:21 AM Aleatha C wrote: Reason for CRM: Patient is out of the levothyroxine  (SYNTHROID ) 88 MCG tablet and patient would like to know if he should continue with the 88 or the 75mg  and if he should continue with the 88mg  for them to be called in to [facility] pharmacy and to give him a call letting him know if they have been called in

## 2024-09-23 ENCOUNTER — Ambulatory Visit: Admitting: Physician Assistant

## 2024-09-29 NOTE — Progress Notes (Unsigned)
 09/30/2024 Eric Kerr [id number] Oct 22, 1951  Referring provider: Billy Philippe SAUNDERS, NP Primary GI doctor: Dr. Suzann  ASSESSMENT AND PLAN:  GERD with acute onset dysphagia liquids and solids Has had 2 episodes of dysphagia/impaction Most recent with chicken for 30 mins with regurgitation -Schedule EGD with dilatation to evaluate for stenosis, tumor, erosive/infectious esophagititis, and EOE. I discussed risks of EGD with patient today, including risk of sedation, bleeding or perforation.  Patient provides understanding and gave verbal consent to proceed. - consider barium swallow -In the interim patient advised about swallowing precautions.  -Eat slowly, chew food well before swallowing.  -Drink liquids in between each bite to avoid food impaction. - ER precautions discussed with the patient - add on pepcid  at night, declines PPI at this time  History of alpha gal No diarrhea, no significant issues Given information  Screening colonoscopy Completed 11/2018 in [place]  recall 11/2028  Obesity  Body mass index is 35.55 kg/m.  -Patient has been advised to make an attempt to improve diet and exercise patterns to aid in weight loss. -Recommended diet heavy in fruits and veggies and low in animal meats, cheeses, and dairy products, appropriate calorie intake  I have reviewed the clinic note as outlined by Alan Coombs, PA and agree with the assessment, plan and medical decision making.  Eric Kerr presents to the office today for evaluation of GERD, dysphagia and food impaction.  Reports symptoms of food impaction related to the ingestion of nuts as well as chicken.  He had to regurgitate food contents.  Also had symptoms of painful swallowing.  Agree it is reasonable to proceed with upper endoscopy for further evaluation.  If this is unremarkable can consider barium swallow.  He is up-to-date for colorectal cancer screening with a normal colonoscopy in  2019.  Eric Suzann, MD   Patient Care Team: Billy Philippe SAUNDERS, NP as PCP - General (Family Medicine) [facility] at [facility], [place].  HISTORY OF PRESENT ILLNESS: 73 y.o. male with a past medical history listed below presents for evaluation of GERD.   Discussed the use of AI scribe software for clinical note transcription with the patient, who gave verbal consent to proceed.  History of Present Illness   Eric Kerr is a 73 year old male who presents with difficulty swallowing and episodes of food impaction. He was referred by Dr. Billy for evaluation of his swallowing difficulties.  He has been experiencing difficulty swallowing and episodes of food impaction. Approximately two months ago, while in [place] , he experienced painful swallowing that lasted a few days and then resolved. Recently, while in [place] , he experienced a food impaction after eating chicken too quickly, which resolved after he vomited. He notes similar episodes with almonds.  He reports occasional heartburn, occurring two to three times a month, typically at night. No heartburn during the day.  No history of chest pain, shortness of breath, nausea, vomiting, or dark stools. He has a history of smoking but quit many years ago. He occasionally consumes alcohol, typically a beer after golfing. He takes ibuprofen four to five times a week for thumb pain.  He was told by a prior doctor that he had positive labs for alpha-gal syndrome in 2024, but he does not recall being diagnosed or advised of dietary restrictions. He does report occasional lip swelling and a history of urticaria. He does not recall any specific dietary restrictions and continues to consume pork without  issues. He occasionally experiences lip swelling, managed with Xyzal .        He  reports that he quit smoking about 15 years ago. His smoking use included cigarettes and cigars. He started  smoking about 25 years ago. He has a 5 pack-year smoking history. He has quit using smokeless tobacco.  His smokeless tobacco use included chew. He reports current alcohol use of about 7.0 standard drinks of alcohol per week. He reports that he does not use drugs.  RELEVANT GI HISTORY, IMAGING AND LABS: Results   LABS Alpha-gal IgE: Elevated for lamb and pork (05/2023)  DIAGNOSTIC Colonoscopy: Normal (11/2018)      CBC    Component Value Date/Time   WBC 7.6 07/22/2024 0912   RBC 5.18 07/22/2024 0912   HGB 14.6 07/22/2024 0912   HGB 14.2 05/21/2023 1206   HCT 44.3 07/22/2024 0912   HCT 42.4 05/21/2023 1206   PLT 226.0 07/22/2024 0912   MCV 85.4 07/22/2024 0912   MCV 86 05/21/2023 1206   MCH 28.9 05/21/2023 1206   MCHC 33.0 07/22/2024 0912   RDW 14.0 07/22/2024 0912   RDW 13.0 05/21/2023 1206   LYMPHSABS 1.4 07/22/2024 0912   LYMPHSABS 1.3 05/21/2023 1206   MONOABS 0.6 07/22/2024 0912   EOSABS 0.3 07/22/2024 0912   EOSABS 0.2 05/21/2023 1206   BASOSABS 0.1 07/22/2024 0912   BASOSABS 0.0 05/21/2023 1206   Recent Labs    07/22/24 0912  HGB 14.6    CMP     Component Value Date/Time   NA 138 07/22/2024 0912   NA 139 05/21/2023 1206   K 4.9 07/22/2024 0912   CL 99 07/22/2024 0912   CO2 33 (H) 07/22/2024 0912   GLUCOSE 86 07/22/2024 0912   BUN 15 07/22/2024 0912   BUN 13 05/21/2023 1206   CREATININE 1.13 07/22/2024 0912   CALCIUM 9.7 07/22/2024 0912   PROT 6.9 07/22/2024 0912   PROT 7.0 05/21/2023 1206   ALBUMIN 4.1 07/22/2024 0912   ALBUMIN 4.2 05/21/2023 1206   AST 20 07/22/2024 0912   ALT 28 07/22/2024 0912   ALKPHOS 77 07/22/2024 0912   BILITOT 0.5 07/22/2024 0912   BILITOT 0.4 05/21/2023 1206      Latest Ref Rng & Units 07/22/2024    9:12 AM 01/23/2024    8:21 AM 05/21/2023   12:06 PM  Hepatic Function  Total Protein 6.0 - 8.3 g/dL 6.9  7.2  7.0   Albumin 3.5 - 5.2 g/dL 4.1  4.0  4.2   AST 0 - 37 U/L 20  23  26    ALT 0 - 53 U/L 28  25  25    Alk  Phosphatase 39 - 117 U/L 77  71  91   Total Bilirubin 0.2 - 1.2 mg/dL 0.5  0.5  0.4       Current Medications:   Current Outpatient Medications (Endocrine & Metabolic):    levothyroxine  (SYNTHROID ) 88 MCG tablet, Take 1 tablet (88 mcg total) by mouth daily.  Current Outpatient Medications (Cardiovascular):    simvastatin  (ZOCOR ) 20 MG tablet, Take 1 tablet (20 mg total) by mouth daily.     Current Outpatient Medications (Other):    Ascorbic Acid (VITAMIN C) 1000 MG tablet, Take 1,000 mg by mouth daily.   Cholecalciferol (D3 2000) 50 MCG (2000 UT) CAPS,    Creatine Monohydrate 1.5 GM/5ML LIQD, Take 1 mL by mouth daily.   Glucosamine-Chondroit-Vit C-Mn (GLUCOSAMINE CHONDR 1500 COMPLX) CAPS, Take 1 capsule by mouth  in the morning and at bedtime.    Magnesium Glycinate 120 MG CAPS,    Menaquinone-7 (K2 PO), Take by mouth.   Multiple Vitamins-Minerals (MULTIVITAMIN ADULTS 50+) TABS, Take 1 tablet by mouth daily.   Omega-3 Fatty Acids (FISH OIL TRIPLE STRENGTH) 1400 MG CAPS, Take 1 capsule by mouth daily.   Zinc 50 MG TABS, 1 tablet Orally Once a day  Medical History:  Past Medical History:  Diagnosis Date   Acute lateral meniscus tear of right knee    Allergy     Arthritis    Colon polyps    History of chickenpox as child   Hyperlipidemia    Hypothyroidism    Sleep apnea    could not tolerate cpap mild osa   Vertigo    Allergies:  Allergies  Allergen Reactions   Piroxicam Hives and Itching   Meloxicam Hives     Surgical History:  He  has a past surgical history that includes Replacement total knee (Left, 10/2011); Meniscus repair (Right, 03/2021); Tonsillectomy and adenoidectomy (as child); left shoulder arthroscopy; right shoulder tear repaired (2009); Knee arthroscopy with subchondroplasty (Right, 09/28/2020); Colonoscopy; and Joint replacement (December, 2012). Family History:  His family history includes COPD in his mother; Cancer in his father.  REVIEW OF SYSTEMS   : All other systems reviewed and negative except where noted in the History of Present Illness.  PHYSICAL EXAM: BP 122/84   Pulse 62   Ht 5' 7 (1.702 m)   Wt 227 lb (103 kg)   BMI 35.55 kg/m  Physical Exam   GENERAL APPEARANCE: Well nourished, in no apparent distress. HEENT: No cervical lymphadenopathy, unremarkable thyroid , sclerae anicteric, conjunctiva pink. RESPIRATORY: Respiratory effort normal, lungs clear to auscultation bilaterally, breath sounds equal bilaterally without rales, rhonchi, or wheezing. CARDIO: RRR with no MRGs, peripheral pulses intact. ABDOMEN: Soft, non distended, active bowel sounds in all 4 quadrants, no tenderness to palpation, no rebound, no mass appreciated. RECTAL: Declines. MUSCULOSKELETAL: Full ROM, normal gait, without edema. SKIN: Dry, intact without rashes or lesions. No jaundice. NEURO: Alert, oriented, no focal deficits. PSYCH: Cooperative, normal mood and affect.      Alan JONELLE Coombs, PA-C 11:29 AM

## 2024-09-30 ENCOUNTER — Ambulatory Visit: Admitting: Physician Assistant

## 2024-09-30 ENCOUNTER — Encounter: Payer: Self-pay | Admitting: Physician Assistant

## 2024-09-30 VITALS — BP 122/84 | HR 62 | Temp 98.9°F | Resp 16 | Ht 67.0 in | Wt 227.0 lb

## 2024-09-30 DIAGNOSIS — R1310 Dysphagia, unspecified: Secondary | ICD-10-CM | POA: Diagnosis not present

## 2024-09-30 DIAGNOSIS — Z1211 Encounter for screening for malignant neoplasm of colon: Secondary | ICD-10-CM

## 2024-09-30 DIAGNOSIS — K219 Gastro-esophageal reflux disease without esophagitis: Secondary | ICD-10-CM

## 2024-09-30 NOTE — Patient Instructions (Addendum)
 Dysphagia precautions:  1. Take reflux medications 30+ minutes before food in the morning 2. Begin meals with warm beverage 3. Eat smaller more frequent meals 4. Eat slowly, taking small bites and sips 5. Alternate solids and liquids 6. Avoid foods/liquids that increase acid production 7. Sit upright during and for 30+ minutes after meals to facilitate esophageal clearing 8. All meats should be chopped finely.   If something gets hung in your esophagus and will not come up or go down, proceed to the emergency room.    Can add on famotadine over the counter at night  Silent reflux: Not all heartburn burns...SABRASABRASABRA  What is LPR? Laryngopharyngeal reflux (LPR) or silent reflux is a condition in which acid that is made in the stomach travels up the esophagus (swallowing tube) and gets to the throat. Not everyone with reflux has a lot of heartburn or indigestion. In fact, many people with LPR never have heartburn. This is why LPR is called SILENT REFLUX, and the terms Silent reflux and LPR are often used interchangeably. Because LPR is silent, it is sometimes difficult to diagnose.  How can you tell if you have LPR?  Chronic hoarseness- Some people have hoarseness that comes and goes throat clearing  Cough It can cause shortness of breath and cause asthma like symptoms. a feeling of a lump in the throat  difficulty swallowing a problem with too much nose and throat drainage.  Some people will feel their esophagus spasm which feels like their heart beating hard and fast, this will usually be after a meal, at rest, or lying down at night.    How do I treat this? Treatment for LPR should be individualized, and your doctor will suggest the best treatment for you. Generally there are several treatments for LPR: changing habits and diet to reduce reflux,  medications to reduce stomach acid, and  surgery to prevent reflux. Most people with LPR need to modify how and when they eat, as well as  take some medication, to get well. Sometimes, nonprescription liquid antacids, such as Maalox, Gelucil and Mylanta are recommended. When used, these antacids should be taken four times each day - one tablespoon one hour after each meal and before bedtime. Dietary and lifestyle changes alone are not often enough to control LPR - medications that reduce stomach acid are also usually needed. These must be prescribed by our doctor.   TIPS FOR REDUCING REFLUX AND LPR Control your LIFE-STYLE and your DIET! If you use tobacco, QUIT.  Smoking makes you reflux. After every cigarette you have some LPR.  Don't wear clothing that is too tight, especially around the waist (trousers, corsets, belts).  Do not lie down just after eating...in fact, do not eat within three hours of bedtime.  You should be on a low-fat diet.  Limit your intake of red meat.  Limit your intake of butter.  Avoid fried foods.  Avoid chocolate  Avoid cheese.  Avoid eggs. Specifically avoid caffeine (especially coffee and tea), soda pop (especially cola) and mints.  Avoid alcoholic beverages, particularly in the evening.   Your blood test results are consistent with Alpha-gal syndrome, a type of food allergy . Alpha-gal syndrome makes people allergic to red meat and other products made from mammals.  In the [place] , the condition usually begins with the bite of the [facility] Star tick. The bite transfers a sugar molecule called alpha-gal into the body. In some people, this triggers a reaction from the body's defenses, also called  the immune system. It causes mild to severe allergic reactions to red meat, such as beef, pork or lamb. It also can cause reactions to other foods that come from mammals, such as dairy products or gelatins.  Alpha-gal syndrome treatment involves avoiding the foods that cause your reaction. Always check the ingredient labels on store-bought foods. Make sure they don't have red meat or meat-based  ingredients, such as:  - Pork. [place].  Symptoms of alpha-gal syndrome may lessen or even disappear over time. This is especially true if you don't get any more bites from ticks that carry alpha-gal. Some people with this condition can eat mammal food products again after 1 to 2 years if they don't get any more tick bites.  You have been scheduled for an endoscopy. Please follow written instructions given to you at your visit today.  If you use inhalers (even only as needed), please bring them with you on the day of your procedure.  If you take any of the following medications, they will need to be adjusted prior to your procedure:   DO NOT TAKE 7 DAYS PRIOR TO TEST- Trulicity (dulaglutide) Ozempic, Wegovy (semaglutide) Mounjaro (tirzepatide) Bydureon Bcise (exanatide extended release)  DO NOT TAKE 1 DAY PRIOR TO YOUR TEST Rybelsus (semaglutide) Adlyxin (lixisenatide) Victoza (liraglutide) Byetta (exanatide) ___________________________________________________________________________    Due to recent changes in healthcare laws, you may see the results of your imaging and laboratory studies on MyChart before your provider has had a chance to review them.  We understand that in some cases there may be results that are confusing or concerning to you. Not all laboratory results come back in the same time frame and the provider may be waiting for multiple results in order to interpret others.  Please give us  48 hours in order for your provider to thoroughly review all the results before contacting the office for clarification of your results.    I appreciate the  opportunity to care for you  Thank You   [facility]

## 2024-10-13 NOTE — Progress Notes (Unsigned)
 [facility] History and Physical   Primary Care Physician:  Billy Philippe SAUNDERS, NP   Reason for Procedure:  GERD, dysphagia  Plan:    Upper endoscopy with possible dilation     HPI: Eric Kerr is a 73 y.o. male undergoing upper endoscopy with possible dilation for investigation of GERD and dysphagia.  Patient was recently seen in the office reporting symptoms of food impaction to nuts as well as chicken.  States he had to regurgitate food contents.  Also had symptoms of painful swallowing.  At the time of clinic visit, patient declined taking a PPI.  APP advised Pepcid .  No prior EGD.   Past Medical History:  Diagnosis Date   Acute lateral meniscus tear of right knee    Allergy     Arthritis    Colon polyps    History of chickenpox as child   Hyperlipidemia    Hypothyroidism    Sleep apnea    could not tolerate cpap mild osa   Vertigo     Past Surgical History:  Procedure Laterality Date   COLONOSCOPY     JOINT REPLACEMENT  December, 2012   Left knee replaced   KNEE ARTHROSCOPY WITH SUBCHONDROPLASTY Right 09/28/2020   Procedure: RIGHT KNEE ARTHROSCOPY WITH PARTIAL MEDIAL MENISCECTOMY  AND MEDIAL FEMUR SUBCHONDROPLASTY;  Surgeon: Sharl Selinda Dover, MD;  Location: [facility];  Service: Orthopedics;  Laterality: Right;    left shoulder arthroscopy     MENISCUS REPAIR Right 03/2021   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   left tkr revision 2013   right shoulder tear repaired  2009   TONSILLECTOMY AND ADENOIDECTOMY  as child    Prior to Admission medications   Medication Sig Start Date End Date Taking? Authorizing Provider  Ascorbic Acid (VITAMIN C) 1000 MG tablet Take 1,000 mg by mouth daily.    [provider]  Cholecalciferol (D3 2000) 50 MCG (2000 UT) CAPS  12/19/23   [provider]  Creatine Monohydrate 1.5 GM/5ML LIQD Take 1 mL by mouth daily. 03/19/23   [provider]  Glucosamine-Chondroit-Vit  C-Mn (GLUCOSAMINE CHONDR 1500 COMPLX) CAPS Take 1 capsule by mouth in the morning and at bedtime.     [provider]  levothyroxine  (SYNTHROID ) 88 MCG tablet Take 1 tablet (88 mcg total) by mouth daily. 09/08/24   Billy Philippe SAUNDERS, NP  Magnesium Glycinate 120 MG CAPS  12/19/23   [provider]  Menaquinone-7 (K2 PO) Take by mouth.    [provider]  Multiple Vitamins-Minerals (MULTIVITAMIN ADULTS 50+) TABS Take 1 tablet by mouth daily.    [provider]  Omega-3 Fatty Acids (FISH OIL TRIPLE STRENGTH) 1400 MG CAPS Take 1 capsule by mouth daily.    [provider]  simvastatin  (ZOCOR ) 20 MG tablet Take 1 tablet (20 mg total) by mouth daily. 01/23/24   Williamson, Joanna R, NP  Zinc 50 MG TABS 1 tablet Orally Once a day    [provider]    Current Outpatient Medications  Medication Sig Dispense Refill   Ascorbic Acid (VITAMIN C) 1000 MG tablet Take 1,000 mg by mouth daily.     Cholecalciferol (D3 2000) 50 MCG (2000 UT) CAPS      Creatine Monohydrate 1.5 GM/5ML LIQD Take 1 mL by mouth daily.     Glucosamine-Chondroit-Vit C-Mn (GLUCOSAMINE CHONDR 1500 COMPLX) CAPS Take 1 capsule by mouth in the morning and at bedtime.      levothyroxine  (SYNTHROID ) 88 MCG tablet  Take 1 tablet (88 mcg total) by mouth daily. 90 tablet 3   Magnesium Glycinate 120 MG CAPS      Menaquinone-7 (K2 PO) Take by mouth.     Multiple Vitamins-Minerals (MULTIVITAMIN ADULTS 50+) TABS Take 1 tablet by mouth daily.     Omega-3 Fatty Acids (FISH OIL TRIPLE STRENGTH) 1400 MG CAPS Take 1 capsule by mouth daily.     simvastatin  (ZOCOR ) 20 MG tablet Take 1 tablet (20 mg total) by mouth daily. 90 tablet 3   Zinc 50 MG TABS 1 tablet Orally Once a day     No current facility-administered medications for this visit.    Allergies as of 10/14/2024 - Review Complete 09/30/2024  Allergen Reaction Noted   Piroxicam Hives and Itching 05/11/2021   Meloxicam Hives 09/04/2024     Family History  Problem Relation Age of Onset   COPD Mother    Cancer Father        Prostate    Social History   Socioeconomic History   Marital status: Married    Spouse name: Not on file   Number of children: 3   Years of education: Not on file   Highest education level: 12th grade  Occupational History   Not on file  Tobacco Use   Smoking status: Former    Current packs/day: 0.00    Average packs/day: 0.5 packs/day for 10.0 years (5.0 ttl pk-yrs)    Types: Cigarettes, Cigars    Start date: 12/18/1998    Quit date: 12/18/2008    Years since quitting: 15.8   Smokeless tobacco: Former    Types: Chew   Tobacco comments:    No current use  Vaping Use   Vaping status: Never Used  Substance and Sexual Activity   Alcohol use: Yes    Alcohol/week: 7.0 standard drinks of alcohol    Types: 7 Standard drinks or equivalent per week    Comment: 1 to 2 beers per day   Drug use: Never   Sexual activity: Not Currently  Other Topics Concern   Not on file  Social History Narrative   Retired.    Social Drivers of Corporate Investment Banker Strain: Low Risk  (09/03/2024)   Overall Financial Resource Strain (CARDIA)    Difficulty of Paying Living Expenses: Not hard at all  Food Insecurity: No Food Insecurity (09/03/2024)   Hunger Vital Sign    Worried About Running Out of Food in the Last Year: Never true    Ran Out of Food in the Last Year: Never true  Transportation Needs: No Transportation Needs (09/03/2024)   PRAPARE - Administrator, Civil Service (Medical): No    Lack of Transportation (Non-Medical): No  Physical Activity: Insufficiently Active (09/03/2024)   Exercise Vital Sign    Days of Exercise per Week: 2 days    Minutes of Exercise per Session: 20 min  Stress: No Stress Concern Present (01/19/2024)   [employer] of Occupational Health - Occupational Stress Questionnaire    Feeling of Stress : Only a little  Social Connections: Unknown (09/03/2024)    Social Connection and Isolation Panel    Frequency of Communication with Friends and Family: Once a week    Frequency of Social Gatherings with Friends and Family: Three times a week    Attends Religious Services: Not on file    Active Member of Clubs or Organizations: No    Attends Banker Meetings: Not on file  Marital Status: Not on file  Intimate Partner Violence: Not At Risk (04/12/2023)   Humiliation, Afraid, Rape, and Kick questionnaire    Fear of Current or Ex-Partner: No    Emotionally Abused: No    Physically Abused: No    Sexually Abused: No    Review of Systems:  All other review of systems negative except as mentioned in the HPI.  Physical Exam: Vital signs There were no vitals taken for this visit.  General:   Alert,  Well-developed, well-nourished, pleasant and cooperative in NAD Airway:  Mallampati  Lungs:  Clear throughout to auscultation.   Heart:  Regular rate and rhythm; no murmurs, clicks, rubs,  or gallops. Abdomen:  Soft, nontender and nondistended. Normal bowel sounds.   Neuro/Psych:  Normal mood and affect. A and O x 3  Inocente Hausen, MD [facility]

## 2024-10-14 ENCOUNTER — Ambulatory Visit (AMBULATORY_SURGERY_CENTER): Admitting: Pediatrics

## 2024-10-14 ENCOUNTER — Encounter: Payer: Self-pay | Admitting: Pediatrics

## 2024-10-14 VITALS — BP 127/74 | HR 58 | Temp 97.5°F | Resp 18 | Ht 67.0 in | Wt 227.0 lb

## 2024-10-14 DIAGNOSIS — K2950 Unspecified chronic gastritis without bleeding: Secondary | ICD-10-CM | POA: Diagnosis not present

## 2024-10-14 DIAGNOSIS — K2100 Gastro-esophageal reflux disease with esophagitis, without bleeding: Secondary | ICD-10-CM | POA: Diagnosis not present

## 2024-10-14 DIAGNOSIS — K2970 Gastritis, unspecified, without bleeding: Secondary | ICD-10-CM | POA: Diagnosis not present

## 2024-10-14 DIAGNOSIS — R1310 Dysphagia, unspecified: Secondary | ICD-10-CM

## 2024-10-14 DIAGNOSIS — K2980 Duodenitis without bleeding: Secondary | ICD-10-CM

## 2024-10-14 MED ORDER — PANTOPRAZOLE SODIUM 40 MG PO TBEC: 40.0000 mg | DELAYED_RELEASE_TABLET | Freq: Two times a day (BID) | ORAL | 3 refills | Status: AC

## 2024-10-14 MED ORDER — SODIUM CHLORIDE 0.9 % IV SOLN: 500.0000 mL | Freq: Once | INTRAVENOUS | Status: DC

## 2024-10-14 NOTE — Progress Notes (Signed)
 Called to room to assist during endoscopic procedure.  Patient ID and intended procedure confirmed with present staff. Received instructions for my participation in the procedure from the performing physician.

## 2024-10-14 NOTE — Progress Notes (Signed)
 Pt's states no medical or surgical changes since previsit or office visit.

## 2024-10-14 NOTE — Patient Instructions (Signed)
 Discharge instructions given. Handout on Esophagitis. Prescription sent to pharmacy. Recall for EGD for office to schedule due to schedule not out for January. YOU HAD AN ENDOSCOPIC PROCEDURE TODAY AT THE [facility]:   Refer to the procedure report that was given to you for any specific questions about what was found during the examination.  If the procedure report does not answer your questions, please call your gastroenterologist to clarify.  If you requested that your care partner not be given the details of your procedure findings, then the procedure report has been included in a sealed envelope for you to review at your convenience later.  YOU SHOULD EXPECT: Some feelings of bloating in the abdomen. Passage of more gas than usual.  Walking can help get rid of the air that was put into your GI tract during the procedure and reduce the bloating. If you had a lower endoscopy (such as a colonoscopy or flexible sigmoidoscopy) you may notice spotting of blood in your stool or on the toilet paper. If you underwent a bowel prep for your procedure, you may not have a normal bowel movement for a few days.  Please Note:  You might notice some irritation and congestion in your nose or some drainage.  This is from the oxygen used during your procedure.  There is no need for concern and it should clear up in a day or so.  SYMPTOMS TO REPORT IMMEDIATELY:   Following upper endoscopy (EGD)  Vomiting of blood or coffee ground material  New chest pain or pain under the shoulder blades  Painful or persistently difficult swallowing  New shortness of breath  Fever of 100F or higher  Black, tarry-looking stools  For urgent or emergent issues, a gastroenterologist can be reached at any hour by calling [phone]. Do not use MyChart messaging for urgent concerns.    DIET:  We do recommend a small meal at first, but then you may proceed to your regular diet.  Drink plenty of fluids but you  should avoid alcoholic beverages for 24 hours.  ACTIVITY:  You should plan to take it easy for the rest of today and you should NOT DRIVE or use heavy machinery until tomorrow (because of the sedation medicines used during the test).    FOLLOW UP: Our staff will call the number listed on your records the next business day following your procedure.  We will call around 7:15- 8:00 am to check on you and address any questions or concerns that you may have regarding the information given to you following your procedure. If we do not reach you, we will leave a message.     If any biopsies were taken you will be contacted by phone or by letter within the next 1-3 weeks.  Please call us  at [phone] if you have not heard about the biopsies in 3 weeks.    SIGNATURES/CONFIDENTIALITY: You and/or your care partner have signed paperwork which will be entered into your electronic medical record.  These signatures attest to the fact that that the information above on your After Visit Summary has been reviewed and is understood.  Full responsibility of the confidentiality of this discharge information lies with you and/or your care-partner.

## 2024-10-14 NOTE — Progress Notes (Signed)
 Report to PACU, RN, vss, BBS= Clear.

## 2024-10-14 NOTE — Op Note (Signed)
 [facility] Patient Name: Eric Kerr Procedure Date: 10/14/2024 12:19 PM MRN: [id number] Endoscopist: Inocente Hausen , MD, [id number] Age: 73 Referring MD:  Date of Birth: 1951-05-05 Gender: Male Account #: [id number] Procedure:                Upper GI endoscopy Indications:              Dysphagia, Follow-up of gastro-esophageal reflux                            disease Medicines:                Monitored Anesthesia Care Procedure:                Pre-Anesthesia Assessment:                           - Prior to the procedure, a History and Physical                            was performed, and patient medications and                            allergies were reviewed. The patient's tolerance of                            previous anesthesia was also reviewed. The risks                            and benefits of the procedure and the sedation                            options and risks were discussed with the patient.                            All questions were answered, and informed consent                            was obtained. Prior Anticoagulants: The patient has                            taken no anticoagulant or antiplatelet agents. ASA                            Grade Assessment: II - A patient with mild systemic                            disease. After reviewing the risks and benefits,                            the patient was deemed in satisfactory condition to                            undergo the procedure.  After obtaining informed consent, the endoscope was                            passed under direct vision. Throughout the                            procedure, the patient's blood pressure, pulse, and                            oxygen saturations were monitored continuously. The                            Olympus scope [id number] was introduced through the                            mouth, and advanced to the second part of  duodenum.                            The upper GI endoscopy was accomplished without                            difficulty. The patient tolerated the procedure                            well. Scope In: Scope Out: Findings:                 The upper third of the esophagus and middle third                            of the esophagus were normal. Biopsies were taken                            with a cold forceps for histology.                           LA Grade C (one or more mucosal breaks continuous                            between tops of 2 or more mucosal folds, less than                            75% circumference) esophagitis was found in the                            lower esophagus. Biopsies were taken with a cold                            forceps for histology.                           No esophageal webs, rings, strictures or stenoses  were seen during today's exam.                           The gastric body, cardia (on retroflexion) and                            gastric fundus (on retroflexion) were normal.                            Biopsies were taken with a cold forceps for                            Helicobacter pylori testing.                           Diffuse mildly erythematous mucosa was found in the                            gastric antrum and in the prepyloric region of the                            stomach. Biopsies were taken with a cold forceps                            for Helicobacter pylori testing.                           Diffuse moderately erythematous mucosa was found in                            the duodenal bulb. Biopsies were taken with a cold                            forceps for histology.                           The second portion of the duodenum was normal. Complications:            No immediate complications. Estimated blood loss:                            Minimal. Estimated Blood Loss:     Estimated blood  loss was minimal. Impression:               - Normal upper third of esophagus and middle third                            of esophagus. Biopsied. R/O EoE.                           - LA Grade C erosive esophagitis in the lower                            esophagus. Biopsied. E/O EoE. Erosive esophagitis  likely explains the symptoms of regurgitation,                            dysphagia and pain with swallowing.                           - No esophageal webs, rings, strictures or stenoses                            seen                           - Normal gastric body, cardia and gastric fundus.                            Biopsied.                           - Erythematous mucosa in the antrum and prepyloric                            region of the stomach. Biopsied.                           - Erythematous duodenopathy. Biopsied.                           - Normal second portion of the duodenum. Recommendation:           - Discharge patient to home (ambulatory).                           - Await pathology results.                           - Start pantoprazole 40 mg p.o. twice daily 20 to                            30 minutes before meal for management of erosive                            esophagitis                           - Repeat EGD in 8 to 12 weeks to assess for healing                            of erosive esophagitis and rule out Barrett's                            esophagus                           - The findings and recommendations were discussed                            with the patient's  family.                           - Patient has a contact number available for                            emergencies. The signs and symptoms of potential                            delayed complications were discussed with the                            patient. Return to normal activities tomorrow.                            Written discharge instructions were  provided to the                            patient. Inocente Hausen, MD 10/14/2024 1:43:06 PM This report has been signed electronically.

## 2024-10-15 ENCOUNTER — Telehealth: Payer: Self-pay | Admitting: *Deleted

## 2024-10-15 NOTE — Telephone Encounter (Signed)
  Follow up Call-     10/14/2024   12:33 PM  Call back number  Post procedure Call Back phone  # [phone]  Permission to leave phone message Yes     Patient questions:  Do you have a fever, pain , or abdominal swelling? No. Pain Score  0 *  Have you tolerated food without any problems? Yes.    Have you been able to return to your normal activities? Yes.    Do you have any questions about your discharge instructions: Diet   No. Medications  No. Follow up visit  No.  Do you have questions or concerns about your Care? No.  Actions: * If pain score is 4 or above: No action needed, pain <4.

## 2024-10-17 LAB — SURGICAL PATHOLOGY

## 2024-10-19 ENCOUNTER — Ambulatory Visit: Payer: Self-pay | Admitting: Pediatrics

## 2025-01-26 ENCOUNTER — Ambulatory Visit
